# Patient Record
Sex: Male | Born: 1975 | Race: White | Hispanic: No | Smoking: Former smoker
Health system: Southern US, Community
[De-identification: ages and names within clinical notes are randomized; demographics above are authoritative.]

## PROBLEM LIST (undated history)

## (undated) DIAGNOSIS — I219 Acute myocardial infarction, unspecified: Secondary | ICD-10-CM

## (undated) DIAGNOSIS — F4310 Post-traumatic stress disorder, unspecified: Secondary | ICD-10-CM

## (undated) HISTORY — DX: Acute myocardial infarction, unspecified: I21.9

---

## 2020-05-01 ENCOUNTER — Inpatient Hospital Stay (HOSPITAL_COMMUNITY)
Admission: EM | Admit: 2020-05-01 | Discharge: 2020-05-11 | DRG: 215 | Disposition: A | Discharge status: Home / Self Care | Payer: BC Managed Care – PPO | Attending: Cardiovascular Disease | Admitting: Cardiovascular Disease

## 2020-05-01 ENCOUNTER — Encounter (HOSPITAL_COMMUNITY)
Admission: EM | Disposition: A | Discharge status: Home / Self Care | Payer: Self-pay | Source: Home / Self Care | Attending: Cardiovascular Disease

## 2020-05-01 ENCOUNTER — Emergency Department (HOSPITAL_COMMUNITY): Payer: BC Managed Care – PPO

## 2020-05-01 DIAGNOSIS — R7303 Prediabetes: Secondary | ICD-10-CM | POA: Diagnosis present

## 2020-05-01 DIAGNOSIS — J9601 Acute respiratory failure with hypoxia: Secondary | ICD-10-CM | POA: Diagnosis present

## 2020-05-01 DIAGNOSIS — I4901 Ventricular fibrillation: Secondary | ICD-10-CM | POA: Diagnosis present

## 2020-05-01 DIAGNOSIS — G9341 Metabolic encephalopathy: Secondary | ICD-10-CM | POA: Diagnosis not present

## 2020-05-01 DIAGNOSIS — Z95810 Presence of automatic (implantable) cardiac defibrillator: Secondary | ICD-10-CM | POA: Diagnosis not present

## 2020-05-01 DIAGNOSIS — Z720 Tobacco use: Secondary | ICD-10-CM

## 2020-05-01 DIAGNOSIS — I5041 Acute combined systolic (congestive) and diastolic (congestive) heart failure: Secondary | ICD-10-CM | POA: Diagnosis present

## 2020-05-01 DIAGNOSIS — R570 Cardiogenic shock: Secondary | ICD-10-CM | POA: Diagnosis not present

## 2020-05-01 DIAGNOSIS — E872 Acidosis: Secondary | ICD-10-CM | POA: Diagnosis present

## 2020-05-01 DIAGNOSIS — S299XXA Unspecified injury of thorax, initial encounter: Secondary | ICD-10-CM | POA: Diagnosis present

## 2020-05-01 DIAGNOSIS — I469 Cardiac arrest, cause unspecified: Secondary | ICD-10-CM

## 2020-05-01 DIAGNOSIS — I462 Cardiac arrest due to underlying cardiac condition: Secondary | ICD-10-CM | POA: Diagnosis present

## 2020-05-01 DIAGNOSIS — J14 Pneumonia due to Hemophilus influenzae: Secondary | ICD-10-CM | POA: Diagnosis not present

## 2020-05-01 DIAGNOSIS — K7200 Acute and subacute hepatic failure without coma: Secondary | ICD-10-CM | POA: Diagnosis present

## 2020-05-01 DIAGNOSIS — I2582 Chronic total occlusion of coronary artery: Secondary | ICD-10-CM | POA: Diagnosis present

## 2020-05-01 DIAGNOSIS — E875 Hyperkalemia: Secondary | ICD-10-CM | POA: Diagnosis not present

## 2020-05-01 DIAGNOSIS — N179 Acute kidney failure, unspecified: Secondary | ICD-10-CM | POA: Diagnosis present

## 2020-05-01 DIAGNOSIS — I5021 Acute systolic (congestive) heart failure: Secondary | ICD-10-CM | POA: Diagnosis not present

## 2020-05-01 DIAGNOSIS — R0603 Acute respiratory distress: Secondary | ICD-10-CM

## 2020-05-01 DIAGNOSIS — J690 Pneumonitis due to inhalation of food and vomit: Secondary | ICD-10-CM | POA: Diagnosis not present

## 2020-05-01 DIAGNOSIS — I2510 Atherosclerotic heart disease of native coronary artery without angina pectoris: Secondary | ICD-10-CM

## 2020-05-01 DIAGNOSIS — E876 Hypokalemia: Secondary | ICD-10-CM | POA: Diagnosis present

## 2020-05-01 DIAGNOSIS — I509 Heart failure, unspecified: Secondary | ICD-10-CM

## 2020-05-01 DIAGNOSIS — E785 Hyperlipidemia, unspecified: Secondary | ICD-10-CM | POA: Diagnosis present

## 2020-05-01 DIAGNOSIS — Z20822 Contact with and (suspected) exposure to covid-19: Secondary | ICD-10-CM | POA: Diagnosis present

## 2020-05-01 DIAGNOSIS — I472 Ventricular tachycardia: Secondary | ICD-10-CM | POA: Diagnosis not present

## 2020-05-01 DIAGNOSIS — S2241XA Multiple fractures of ribs, right side, initial encounter for closed fracture: Secondary | ICD-10-CM | POA: Diagnosis present

## 2020-05-01 DIAGNOSIS — Z885 Allergy status to narcotic agent status: Secondary | ICD-10-CM

## 2020-05-01 DIAGNOSIS — I2102 ST elevation (STEMI) myocardial infarction involving left anterior descending coronary artery: Principal | ICD-10-CM | POA: Diagnosis present

## 2020-05-01 DIAGNOSIS — Z0189 Encounter for other specified special examinations: Secondary | ICD-10-CM

## 2020-05-01 DIAGNOSIS — I255 Ischemic cardiomyopathy: Secondary | ICD-10-CM | POA: Diagnosis not present

## 2020-05-01 DIAGNOSIS — Z6834 Body mass index (BMI) 34.0-34.9, adult: Secondary | ICD-10-CM

## 2020-05-01 DIAGNOSIS — E871 Hypo-osmolality and hyponatremia: Secondary | ICD-10-CM | POA: Diagnosis not present

## 2020-05-01 DIAGNOSIS — F17210 Nicotine dependence, cigarettes, uncomplicated: Secondary | ICD-10-CM | POA: Diagnosis present

## 2020-05-01 DIAGNOSIS — I213 ST elevation (STEMI) myocardial infarction of unspecified site: Secondary | ICD-10-CM

## 2020-05-01 DIAGNOSIS — J9602 Acute respiratory failure with hypercapnia: Secondary | ICD-10-CM | POA: Diagnosis present

## 2020-05-01 DIAGNOSIS — Z9289 Personal history of other medical treatment: Secondary | ICD-10-CM

## 2020-05-01 HISTORY — PX: VENTRICULAR ASSIST DEVICE INSERTION: CATH118273

## 2020-05-01 HISTORY — PX: LEFT HEART CATH AND CORONARY ANGIOGRAPHY: CATH118249

## 2020-05-01 HISTORY — PX: CORONARY/GRAFT ACUTE MI REVASCULARIZATION: CATH118305

## 2020-05-01 LAB — POCT I-STAT 7, (LYTES, BLD GAS, ICA,H+H)
Acid-base deficit: 6 mmol/L — ABNORMAL HIGH (ref 0.0–2.0)
Acid-base deficit: 9 mmol/L — ABNORMAL HIGH (ref 0.0–2.0)
Acid-base deficit: 5 mmol/L — ABNORMAL HIGH (ref 0.0–2.0)
Bicarbonate: 19.2 mmol/L — ABNORMAL LOW (ref 20.0–28.0)
Bicarbonate: 21.8 mmol/L (ref 20.0–28.0)
Bicarbonate: 21.2 mmol/L (ref 20.0–28.0)
Calcium, Ion: 1.17 mmol/L (ref 1.15–1.40)
Calcium, Ion: 1.17 mmol/L (ref 1.15–1.40)
Calcium, Ion: 1.09 mmol/L — ABNORMAL LOW (ref 1.15–1.40)
HCT: 41 % (ref 39.0–52.0)
HCT: 43 % (ref 39.0–52.0)
HCT: 39 % (ref 39.0–52.0)
Hemoglobin: 13.9 g/dL (ref 13.0–17.0)
Hemoglobin: 14.6 g/dL (ref 13.0–17.0)
Hemoglobin: 13.3 g/dL (ref 13.0–17.0)
O2 Saturation: 100 %
O2 Saturation: 100 %
O2 Saturation: 100 %
Patient temperature: 98.9
Patient temperature: 98.1
Potassium: 3.1 mmol/L — ABNORMAL LOW (ref 3.5–5.1)
Potassium: 3.5 mmol/L (ref 3.5–5.1)
Potassium: 4.3 mmol/L (ref 3.5–5.1)
Sodium: 136 mmol/L (ref 135–145)
Sodium: 136 mmol/L (ref 135–145)
Sodium: 137 mmol/L (ref 135–145)
TCO2: 21 mmol/L — ABNORMAL LOW (ref 22–32)
TCO2: 23 mmol/L (ref 22–32)
TCO2: 22 mmol/L (ref 22–32)
pCO2 arterial: 49.6 mmHg — ABNORMAL HIGH (ref 32.0–48.0)
pCO2 arterial: 51.6 mmHg — ABNORMAL HIGH (ref 32.0–48.0)
pCO2 arterial: 41.1 mmHg (ref 32.0–48.0)
pH, Arterial: 7.18 — CL (ref 7.350–7.450)
pH, Arterial: 7.25 — ABNORMAL LOW (ref 7.350–7.450)
pH, Arterial: 7.319 — ABNORMAL LOW (ref 7.350–7.450)
pO2, Arterial: 355 mmHg — ABNORMAL HIGH (ref 83.0–108.0)
pO2, Arterial: 503 mmHg — ABNORMAL HIGH (ref 83.0–108.0)
pO2, Arterial: 235 mmHg — ABNORMAL HIGH (ref 83.0–108.0)

## 2020-05-01 LAB — COMPREHENSIVE METABOLIC PANEL
ALT: 166 U/L — ABNORMAL HIGH (ref 0–44)
AST: 168 U/L — ABNORMAL HIGH (ref 15–41)
Albumin: 3.7 g/dL (ref 3.5–5.0)
Alkaline Phosphatase: 74 U/L (ref 38–126)
Anion gap: 16 — ABNORMAL HIGH (ref 5–15)
BUN: 15 mg/dL (ref 6–20)
CO2: 16 mmol/L — ABNORMAL LOW (ref 22–32)
Calcium: 8.5 mg/dL — ABNORMAL LOW (ref 8.9–10.3)
Chloride: 103 mmol/L (ref 98–111)
Creatinine, Ser: 1.32 mg/dL — ABNORMAL HIGH (ref 0.61–1.24)
GFR, Estimated: >60 mL/min (ref >60)
Glucose, Bld: 221 mg/dL — ABNORMAL HIGH (ref 70–99)
Potassium: 3.2 mmol/L — ABNORMAL LOW (ref 3.5–5.1)
Sodium: 135 mmol/L (ref 135–145)
Total Bilirubin: 0.9 mg/dL (ref 0.3–1.2)
Total Protein: 6.3 g/dL — ABNORMAL LOW (ref 6.5–8.1)

## 2020-05-01 LAB — CBC WITH DIFFERENTIAL/PLATELET
Abs Immature Granulocytes: 0.4 10*3/uL — ABNORMAL HIGH (ref 0.00–0.07)
Basophils Absolute: 0.1 10*3/uL (ref 0.0–0.1)
Basophils Relative: 0 %
Eosinophils Absolute: 0.1 10*3/uL (ref 0.0–0.5)
Eosinophils Relative: 1 %
HCT: 43.5 % (ref 39.0–52.0)
Hemoglobin: 14.3 g/dL (ref 13.0–17.0)
Immature Granulocytes: 2 %
Lymphocytes Relative: 31 %
Lymphs Abs: 5.9 10*3/uL — ABNORMAL HIGH (ref 0.7–4.0)
MCH: 31.6 pg (ref 26.0–34.0)
MCHC: 32.9 g/dL (ref 30.0–36.0)
MCV: 96 fL (ref 80.0–100.0)
Monocytes Absolute: 1.1 10*3/uL — ABNORMAL HIGH (ref 0.1–1.0)
Monocytes Relative: 6 %
Neutro Abs: 11.7 10*3/uL — ABNORMAL HIGH (ref 1.7–7.7)
Neutrophils Relative %: 60 %
Platelets: 303 10*3/uL (ref 150–400)
RBC: 4.53 MIL/uL (ref 4.22–5.81)
RDW: 12.9 % (ref 11.5–15.5)
WBC: 19.2 10*3/uL — ABNORMAL HIGH (ref 4.0–10.5)
nRBC: 0 % (ref 0.0–0.2)

## 2020-05-01 LAB — TROPONIN I (HIGH SENSITIVITY): Troponin I (High Sensitivity): 5667 ng/L (ref <18)

## 2020-05-01 LAB — I-STAT CHEM 8, ED
BUN: 17 mg/dL (ref 6–20)
Calcium, Ion: 1.08 mmol/L — ABNORMAL LOW (ref 1.15–1.40)
Chloride: 105 mmol/L (ref 98–111)
Creatinine, Ser: 1.2 mg/dL (ref 0.61–1.24)
Glucose, Bld: 228 mg/dL — ABNORMAL HIGH (ref 70–99)
HCT: 43 % (ref 39.0–52.0)
Hemoglobin: 14.6 g/dL (ref 13.0–17.0)
Potassium: 3.1 mmol/L — ABNORMAL LOW (ref 3.5–5.1)
Sodium: 138 mmol/L (ref 135–145)
TCO2: 17 mmol/L — ABNORMAL LOW (ref 22–32)

## 2020-05-01 LAB — POCT ACTIVATED CLOTTING TIME
Activated Clotting Time: 267 seconds
Activated Clotting Time: 297 seconds

## 2020-05-01 LAB — HEMOGLOBIN A1C
Hgb A1c MFr Bld: 6.1 % — ABNORMAL HIGH (ref 4.8–5.6)
Mean Plasma Glucose: 128.37 mg/dL

## 2020-05-01 LAB — APTT: aPTT: 28 seconds (ref 24–36)

## 2020-05-01 LAB — LIPID PANEL
Cholesterol: 242 mg/dL — ABNORMAL HIGH (ref 0–200)
HDL: 29 mg/dL — ABNORMAL LOW (ref >40)
LDL Cholesterol: UNDETERMINED mg/dL (ref 0–99)
Total CHOL/HDL Ratio: 8.3 RATIO
Triglycerides: 469 mg/dL — ABNORMAL HIGH (ref <150)
VLDL: UNDETERMINED mg/dL (ref 0–40)

## 2020-05-01 LAB — GLUCOSE, CAPILLARY: Glucose-Capillary: 177 mg/dL — ABNORMAL HIGH (ref 70–99)

## 2020-05-01 LAB — PROTIME-INR
INR: 1.1 (ref 0.8–1.2)
Prothrombin Time: 13.8 seconds (ref 11.4–15.2)

## 2020-05-01 LAB — LDL CHOLESTEROL, DIRECT: Direct LDL: 106.8 mg/dL — ABNORMAL HIGH (ref 0–99)

## 2020-05-01 SURGERY — CORONARY/GRAFT ACUTE MI REVASCULARIZATION
Anesthesia: LOCAL

## 2020-05-01 MED ORDER — TIROFIBAN HCL IN NACL 5-0.9 MG/100ML-% IV SOLN
0.1500 ug/kg/min | INTRAVENOUS | Status: DC
  Administered 2020-05-02: 0.15 ug/kg/min via INTRAVENOUS
  Filled 2020-05-01: qty 100

## 2020-05-01 MED ORDER — FENTANYL CITRATE (PF) 100 MCG/2ML IJ SOLN
INTRAMUSCULAR | Status: DC | PRN
  Administered 2020-05-01 (×2): 50 ug via INTRAVENOUS

## 2020-05-01 MED ORDER — TIROFIBAN (AGGRASTAT) BOLUS VIA INFUSION
INTRAVENOUS | Status: DC | PRN
  Administered 2020-05-01: 3118.475 ug via INTRAVENOUS

## 2020-05-01 MED ORDER — FENTANYL CITRATE (PF) 100 MCG/2ML IJ SOLN: 50.0000 ug | Freq: Once | INTRAMUSCULAR | Status: DC

## 2020-05-01 MED ORDER — NOREPINEPHRINE 4 MG/250ML-% IV SOLN
0.0000 ug/min | INTRAVENOUS | Status: DC
  Administered 2020-05-02: 8 ug/min via INTRAVENOUS
  Administered 2020-05-02: 12 ug/min via INTRAVENOUS
  Administered 2020-05-02: 5 ug/min via INTRAVENOUS
  Administered 2020-05-03 (×2): 10 ug/min via INTRAVENOUS
  Administered 2020-05-05: 2 ug/min via INTRAVENOUS
  Filled 2020-05-01 (×5): qty 250

## 2020-05-01 MED ORDER — HEPARIN SODIUM (PORCINE) 1000 UNIT/ML IJ SOLN
INTRAMUSCULAR | Status: DC | PRN
  Administered 2020-05-01: 3000 [IU] via INTRAVENOUS

## 2020-05-01 MED ORDER — HEPARIN SODIUM (PORCINE) 1000 UNIT/ML IJ SOLN
INTRAMUSCULAR | Status: DC | PRN
  Administered 2020-05-01: 12000 [IU] via INTRAVENOUS

## 2020-05-01 MED ORDER — AMIODARONE HCL IN DEXTROSE 360-4.14 MG/200ML-% IV SOLN
60.0000 mg/h | INTRAVENOUS | Status: AC
  Filled 2020-05-01: qty 200

## 2020-05-01 MED ORDER — MIDAZOLAM HCL 2 MG/2ML IJ SOLN
INTRAMUSCULAR | Status: AC
  Filled 2020-05-01: qty 2

## 2020-05-01 MED ORDER — FENTANYL 2500MCG IN NS 250ML (10MCG/ML) PREMIX INFUSION
50.0000 ug/h | INTRAVENOUS | Status: DC
  Administered 2020-05-01: 100 ug/h via INTRAVENOUS
  Administered 2020-05-02 – 2020-05-03 (×4): 200 ug/h via INTRAVENOUS
  Administered 2020-05-04: 150 ug/h via INTRAVENOUS
  Administered 2020-05-05: 200 ug/h via INTRAVENOUS
  Administered 2020-05-05: 150 ug/h via INTRAVENOUS
  Administered 2020-05-05: 200 ug/h via INTRAVENOUS
  Filled 2020-05-01 (×10): qty 250

## 2020-05-01 MED ORDER — SODIUM CHLORIDE 0.9 % IV SOLN
INTRAVENOUS | Status: AC | PRN
  Administered 2020-05-01: 10 mL/h via INTRAVENOUS

## 2020-05-01 MED ORDER — SODIUM CHLORIDE 0.9 % IV SOLN
INTRAVENOUS | Status: AC | PRN
  Administered 2020-05-01: 140 mL/h via INTRAVENOUS

## 2020-05-01 MED ORDER — NITROGLYCERIN 0.4 MG SL SUBL: 0.4000 mg | SUBLINGUAL_TABLET | SUBLINGUAL | Status: DC | PRN

## 2020-05-01 MED ORDER — SODIUM BICARBONATE 8.4 % IV SOLN
INTRAVENOUS | Status: DC | PRN
  Administered 2020-05-01: 50 meq via INTRAVENOUS

## 2020-05-01 MED ORDER — MIDAZOLAM 50MG/50ML (1MG/ML) PREMIX INFUSION
0.5000 mg/h | INTRAVENOUS | Status: DC
  Administered 2020-05-01: 2 mg/h via INTRAVENOUS
  Administered 2020-05-02 (×2): 3 mg/h via INTRAVENOUS
  Administered 2020-05-03 (×2): 5 mg/h via INTRAVENOUS
  Administered 2020-05-04: 6 mg/h via INTRAVENOUS
  Filled 2020-05-01 (×5): qty 50

## 2020-05-01 MED ORDER — AMIODARONE HCL IN DEXTROSE 360-4.14 MG/200ML-% IV SOLN
INTRAVENOUS | Status: AC
  Administered 2020-05-02: 60 mg/h via INTRAVENOUS
  Filled 2020-05-01: qty 200

## 2020-05-01 MED ORDER — CANGRELOR BOLUS VIA INFUSION
INTRAVENOUS | Status: DC | PRN
  Administered 2020-05-01: 3742.17 ug via INTRAVENOUS

## 2020-05-01 MED ORDER — SODIUM CHLORIDE 0.9 % IV SOLN
INTRAVENOUS | Status: DC | PRN
  Administered 2020-05-01: 4 ug/kg/min via INTRAVENOUS

## 2020-05-01 MED ORDER — SODIUM BICARBONATE 8.4 % IV SOLN
INTRAVENOUS | Status: AC
  Filled 2020-05-01: qty 50

## 2020-05-01 MED ORDER — TIROFIBAN HCL IN NACL 5-0.9 MG/100ML-% IV SOLN
INTRAVENOUS | Status: AC
  Filled 2020-05-01: qty 100

## 2020-05-01 MED ORDER — LIDOCAINE HCL (PF) 1 % IJ SOLN
INTRAMUSCULAR | Status: DC | PRN
  Administered 2020-05-01 (×2): 15 mL

## 2020-05-01 MED ORDER — ONDANSETRON HCL 4 MG/2ML IJ SOLN: 4.0000 mg | Freq: Four times a day (QID) | INTRAMUSCULAR | Status: DC | PRN

## 2020-05-01 MED ORDER — HEPARIN SODIUM (PORCINE) 1000 UNIT/ML IJ SOLN
INTRAMUSCULAR | Status: AC
  Filled 2020-05-01: qty 2

## 2020-05-01 MED ORDER — FENTANYL CITRATE (PF) 100 MCG/2ML IJ SOLN
INTRAMUSCULAR | Status: AC
  Filled 2020-05-01: qty 2

## 2020-05-01 MED ORDER — ROCURONIUM BROMIDE 50 MG/5ML IV SOLN
100.0000 mg | Freq: Once | INTRAVENOUS | Status: AC
  Administered 2020-05-01: 100 mg via INTRAVENOUS
  Filled 2020-05-01: qty 10

## 2020-05-01 MED ORDER — MIDAZOLAM HCL 2 MG/2ML IJ SOLN
INTRAMUSCULAR | Status: DC | PRN
  Administered 2020-05-01 (×3): 2 mg via INTRAVENOUS

## 2020-05-01 MED ORDER — HEPARIN (PORCINE) IN NACL 1000-0.9 UT/500ML-% IV SOLN
INTRAVENOUS | Status: AC
  Filled 2020-05-01: qty 1000

## 2020-05-01 MED ORDER — KETAMINE HCL 50 MG/5ML IJ SOSY: 100.0000 mg | PREFILLED_SYRINGE | Freq: Once | INTRAMUSCULAR | Status: DC

## 2020-05-01 MED ORDER — HEPARIN (PORCINE) IN NACL 1000-0.9 UT/500ML-% IV SOLN
INTRAVENOUS | Status: DC | PRN
  Administered 2020-05-01 (×2): 500 mL

## 2020-05-01 MED ORDER — TIROFIBAN HCL IN NACL 5-0.9 MG/100ML-% IV SOLN
INTRAVENOUS | Status: AC | PRN
  Administered 2020-05-01: 0.15 ug/kg/min via INTRAVENOUS

## 2020-05-01 MED ORDER — CANGRELOR TETRASODIUM 50 MG IV SOLR
INTRAVENOUS | Status: AC
  Filled 2020-05-01: qty 50

## 2020-05-01 MED ORDER — HEPARIN (PORCINE) IN NACL 1000-0.9 UT/500ML-% IV SOLN
INTRAVENOUS | Status: DC | PRN
  Administered 2020-05-01: 500 mL

## 2020-05-01 MED ORDER — MIDAZOLAM 50MG/50ML (1MG/ML) PREMIX INFUSION
INTRAVENOUS | Status: AC
  Filled 2020-05-01: qty 50

## 2020-05-01 MED ORDER — ETOMIDATE 2 MG/ML IV SOLN
20.0000 mg | Freq: Once | INTRAVENOUS | Status: AC
  Administered 2020-05-01 – 2020-05-02 (×2): 20 mg via INTRAVENOUS

## 2020-05-01 MED ORDER — NOREPINEPHRINE 4 MG/250ML-% IV SOLN
INTRAVENOUS | Status: AC
  Filled 2020-05-01: qty 250

## 2020-05-01 MED ORDER — ASPIRIN 81 MG PO CHEW
81.0000 mg | CHEWABLE_TABLET | Freq: Every day | ORAL | Status: DC
  Administered 2020-05-02 – 2020-05-05 (×4): 81 mg
  Filled 2020-05-01 (×4): qty 1

## 2020-05-01 MED ORDER — FENTANYL BOLUS VIA INFUSION
50.0000 ug | INTRAVENOUS | Status: DC | PRN
Start: 2020-05-01 — End: 2020-05-06
  Administered 2020-05-02 – 2020-05-03 (×3): 50 ug via INTRAVENOUS
  Administered 2020-05-03 – 2020-05-05 (×6): 100 ug via INTRAVENOUS
  Administered 2020-05-05: 50 ug via INTRAVENOUS
  Administered 2020-05-05: 100 ug via INTRAVENOUS
  Administered 2020-05-05 (×2): 50 ug via INTRAVENOUS
  Administered 2020-05-05: 100 ug via INTRAVENOUS
  Administered 2020-05-05 (×2): 50 ug via INTRAVENOUS
  Administered 2020-05-06: 100 ug via INTRAVENOUS
  Administered 2020-05-06 (×2): 50 ug via INTRAVENOUS
  Administered 2020-05-06: 100 ug via INTRAVENOUS
  Filled 2020-05-01: qty 100

## 2020-05-01 MED ORDER — IOHEXOL 350 MG/ML SOLN
INTRAVENOUS | Status: DC | PRN
  Administered 2020-05-01: 75 mL

## 2020-05-01 MED ORDER — IOHEXOL 350 MG/ML SOLN
INTRAVENOUS | Status: AC
  Filled 2020-05-01: qty 1

## 2020-05-01 MED ORDER — ACETAMINOPHEN 160 MG/5ML PO SOLN
650.0000 mg | ORAL | Status: DC | PRN
  Administered 2020-05-03 – 2020-05-05 (×2): 650 mg
  Filled 2020-05-01 (×2): qty 20.3

## 2020-05-01 MED ORDER — HEPARIN (PORCINE) IN NACL 1000-0.9 UT/500ML-% IV SOLN
INTRAVENOUS | Status: AC
  Filled 2020-05-01: qty 500

## 2020-05-01 MED ORDER — ATORVASTATIN CALCIUM 80 MG PO TABS
80.0000 mg | ORAL_TABLET | Freq: Every day | ORAL | Status: DC
  Administered 2020-05-02 – 2020-05-05 (×4): 80 mg
  Filled 2020-05-01 (×4): qty 1

## 2020-05-01 MED ORDER — LIDOCAINE HCL (PF) 1 % IJ SOLN
INTRAMUSCULAR | Status: AC
  Filled 2020-05-01: qty 30

## 2020-05-01 MED ORDER — AMIODARONE HCL IN DEXTROSE 360-4.14 MG/200ML-% IV SOLN
30.0000 mg/h | INTRAVENOUS | Status: DC
  Administered 2020-05-02 – 2020-05-04 (×6): 30 mg/h via INTRAVENOUS
  Filled 2020-05-01 (×4): qty 200

## 2020-05-01 SURGICAL SUPPLY — 26 items
BALLN SAPPHIRE 2.0X12 (BALLOONS) ×2
BALLOON SAPPHIRE 2.0X12 (BALLOONS) ×1 IMPLANT
CATH 5FR JL3.5 JR4 ANG PIG MP (CATHETERS) ×2 IMPLANT
CATH EXPO 5F MPA-1 (CATHETERS) ×2 IMPLANT
CATH INFINITI 5FR JL4 (CATHETERS) ×2 IMPLANT
CATH SWAN GANZ VIP 7.5F (CATHETERS) ×2 IMPLANT
CATH VISTA GUIDE 6FR XBLAD3.5 (CATHETERS) ×2 IMPLANT
GLIDESHEATH SLEND SS 6F .021 (SHEATH) ×2 IMPLANT
GUIDEWIRE INQWIRE 1.5J.035X260 (WIRE) ×1 IMPLANT
INQWIRE 1.5J .035X260CM (WIRE) ×2
KIT ESSENTIALS PG (KITS) ×2 IMPLANT
KIT HEART LEFT (KITS) ×2 IMPLANT
KIT MICROPUNCTURE NIT STIFF (SHEATH) ×2 IMPLANT
MAT PREVALON FULL STRYKER (MISCELLANEOUS) ×2 IMPLANT
PACK CARDIAC CATHETERIZATION (CUSTOM PROCEDURE TRAY) ×2 IMPLANT
SET IMPELLA CP PUMP (CATHETERS) ×2 IMPLANT
SHEATH PINNACLE 5F 10CM (SHEATH) ×2 IMPLANT
SHEATH PINNACLE 6F 10CM (SHEATH) ×2 IMPLANT
SHEATH PINNACLE 8F 10CM (SHEATH) ×2 IMPLANT
SHEATH PROBE COVER 6X72 (BAG) ×2 IMPLANT
SLEEVE REPOSITIONING LENGTH 30 (MISCELLANEOUS) ×2 IMPLANT
TRANSDUCER W/STOPCOCK (MISCELLANEOUS) ×2 IMPLANT
TUBING CIL FLEX 10 FLL-RA (TUBING) ×2 IMPLANT
WIRE COUGAR XT STRL 190CM (WIRE) ×2 IMPLANT
WIRE EMERALD 3MM-J .025X260CM (WIRE) ×2 IMPLANT
WIRE EMERALD 3MM-J .035X150CM (WIRE) ×2 IMPLANT

## 2020-05-01 NOTE — Progress Notes (Signed)
ANTICOAGULATION CONSULT NOTE - Initial Consult  Pharmacy Consult for heparin Indication: Impella CP  No Known Allergies  Patient Measurements:    Vital Signs: Temp: 86.4 F (30.2 C) (04/23 2040) Temp Source: Tympanic (04/23 2040) BP: 104/88 (04/23 2100) Pulse Rate: 120 (04/23 2100)  Labs: Recent Labs    05/01/20 2104 05/01/20 2113  HGB 14.6 13.9  HCT 43.0 41.0  CREATININE 1.20  --     CrCl cannot be calculated (Unknown ideal weight.).   Medical History: No past medical history on file.  Medications:  Scheduled:  . [START ON 05/02/2020] aspirin EC  81 mg Oral Daily  . atorvastatin  80 mg Oral Daily  . [MAR Hold] fentaNYL (SUBLIMAZE) injection  50 mcg Intravenous Once   Infusions:  . sodium chloride 10 mL/hr (05/01/20 2127)  . amiodarone    . [START ON 05/02/2020] amiodarone    . fentaNYL infusion INTRAVENOUS    . norepinephrine (LEVOPHED) Adult infusion      Assessment: 33 yom with OOH cardiac arrest (PEA>CPR>VF, received 2 shocks), EKG showing STEMI. No known AC PTA.  Hgb 13.9, plt 303. Trop [id number]. Plan for aggrastat for 6 hours post-cath.   Received dextrose purge in cath lab - plan to change to standard heparin purge on arrival to unit. Will check hourly ACT, once <180 will start systemic heparin.   Goal of Therapy:  Heparin level 0.2-0.5 units/ml Monitor platelets by anticoagulation protocol: Yes   Plan:  Start heparin purge solution (50 units/mL) on arrival to CVICU Check hourly ACT - once ACT<180, can start systemic heparin infusion Monitor heparin level q6 hr  Monitor s/sx of bleeding, CBC, and HL daily   Sherron Monday, PharmD, BCCCP Clinical Pharmacist  Phone: [phone] 05/01/2020 9:34 PM  Please check AMION for all [facility] Pharmacy phone numbers After 10:00 PM, call Main Pharmacy [phone]

## 2020-05-01 NOTE — Progress Notes (Signed)
Pt transported from ED Trauma A to Cath Lab with no complications.

## 2020-05-01 NOTE — ED Notes (Signed)
amio 150 mg bolus and drip started by sophie rn

## 2020-05-01 NOTE — ED Notes (Signed)
100mg  ketamine given by sophie rn

## 2020-05-01 NOTE — ED Notes (Signed)
Xray at bedside for ems post intubation confirmation
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
## 2020-05-01 NOTE — ED Notes (Signed)
Wife Hart Robinsons [phone] would like an update asap

## 2020-05-01 NOTE — ED Notes (Signed)
Pt 20mg  etomidate and 100mg  rocc given by sophie rn

## 2020-05-01 NOTE — Progress Notes (Signed)
eLink Physician-Brief Progress Note Patient Name: Tyler Jacobson DOB: 1975/11/22 MRN: [id number]   Date of Service  05/01/2020  HPI/Events of Note  Patient with out of hospital cardiac arrest following STEMI, he underwent emergent cardiac cath but PCI was precluded by a distal LAD lesion. He is in cardiogenic shock, on Impella, with acute respiratory failure on the ventilator.  eICU Interventions  New Patient Evaluation completed, E-Link electrolyte protocol ordered per RN request.        Migdalia Dk 05/01/2020, 11:53 PM

## 2020-05-01 NOTE — ED Notes (Signed)
ED Provider dr Audley Hose at bedside.
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
## 2020-05-01 NOTE — Progress Notes (Signed)
   05/01/20 2027  Clinical Encounter Type  Visited With Patient not available  Visit Type Trauma  Referral From Nurse  Consult/Referral To Chaplain  This chaplain received page. Chaplain remains available if needed. This note was prepared by Deneen Harts, M.Div..  For questions please contact by phone [phone].

## 2020-05-01 NOTE — ED Notes (Signed)
 of levo started by sophie rn
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
## 2020-05-02 ENCOUNTER — Inpatient Hospital Stay (HOSPITAL_COMMUNITY): Payer: BC Managed Care – PPO

## 2020-05-02 DIAGNOSIS — I469 Cardiac arrest, cause unspecified: Secondary | ICD-10-CM

## 2020-05-02 DIAGNOSIS — I472 Ventricular tachycardia: Secondary | ICD-10-CM

## 2020-05-02 DIAGNOSIS — I2510 Atherosclerotic heart disease of native coronary artery without angina pectoris: Secondary | ICD-10-CM | POA: Diagnosis not present

## 2020-05-02 DIAGNOSIS — R570 Cardiogenic shock: Secondary | ICD-10-CM

## 2020-05-02 DIAGNOSIS — E876 Hypokalemia: Secondary | ICD-10-CM

## 2020-05-02 DIAGNOSIS — I2102 ST elevation (STEMI) myocardial infarction involving left anterior descending coronary artery: Principal | ICD-10-CM

## 2020-05-02 DIAGNOSIS — Z720 Tobacco use: Secondary | ICD-10-CM

## 2020-05-02 DIAGNOSIS — I4901 Ventricular fibrillation: Secondary | ICD-10-CM

## 2020-05-02 LAB — BASIC METABOLIC PANEL
Anion gap: 11 (ref 5–15)
Anion gap: 7 (ref 5–15)
BUN: 16 mg/dL (ref 6–20)
BUN: 18 mg/dL (ref 6–20)
CO2: 17 mmol/L — ABNORMAL LOW (ref 22–32)
CO2: 20 mmol/L — ABNORMAL LOW (ref 22–32)
Calcium: 8 mg/dL — ABNORMAL LOW (ref 8.9–10.3)
Calcium: 7.9 mg/dL — ABNORMAL LOW (ref 8.9–10.3)
Chloride: 104 mmol/L (ref 98–111)
Chloride: 105 mmol/L (ref 98–111)
Creatinine, Ser: 1.59 mg/dL — ABNORMAL HIGH (ref 0.61–1.24)
Creatinine, Ser: 1.5 mg/dL — ABNORMAL HIGH (ref 0.61–1.24)
GFR, Estimated: 55 mL/min — ABNORMAL LOW (ref >60)
GFR, Estimated: 59 mL/min — ABNORMAL LOW (ref >60)
Glucose, Bld: 152 mg/dL — ABNORMAL HIGH (ref 70–99)
Glucose, Bld: 150 mg/dL — ABNORMAL HIGH (ref 70–99)
Potassium: 5.8 mmol/L — ABNORMAL HIGH (ref 3.5–5.1)
Potassium: 5.4 mmol/L — ABNORMAL HIGH (ref 3.5–5.1)
Sodium: 132 mmol/L — ABNORMAL LOW (ref 135–145)
Sodium: 132 mmol/L — ABNORMAL LOW (ref 135–145)

## 2020-05-02 LAB — POCT I-STAT 7, (LYTES, BLD GAS, ICA,H+H)
Acid-base deficit: 5 mmol/L — ABNORMAL HIGH (ref 0.0–2.0)
Acid-base deficit: 7 mmol/L — ABNORMAL HIGH (ref 0.0–2.0)
Bicarbonate: 22.2 mmol/L (ref 20.0–28.0)
Bicarbonate: 18.3 mmol/L — ABNORMAL LOW (ref 20.0–28.0)
Calcium, Ion: 1.16 mmol/L (ref 1.15–1.40)
Calcium, Ion: 1.12 mmol/L — ABNORMAL LOW (ref 1.15–1.40)
HCT: 39 % (ref 39.0–52.0)
HCT: 34 % — ABNORMAL LOW (ref 39.0–52.0)
Hemoglobin: 13.3 g/dL (ref 13.0–17.0)
Hemoglobin: 11.6 g/dL — ABNORMAL LOW (ref 13.0–17.0)
O2 Saturation: 100 %
O2 Saturation: 93 %
Patient temperature: 37
Patient temperature: 36.9
Potassium: 5.9 mmol/L — ABNORMAL HIGH (ref 3.5–5.1)
Potassium: 4.6 mmol/L (ref 3.5–5.1)
Sodium: 133 mmol/L — ABNORMAL LOW (ref 135–145)
Sodium: 133 mmol/L — ABNORMAL LOW (ref 135–145)
TCO2: 24 mmol/L (ref 22–32)
TCO2: 19 mmol/L — ABNORMAL LOW (ref 22–32)
pCO2 arterial: 46 mmHg (ref 32.0–48.0)
pCO2 arterial: 33.7 mmHg (ref 32.0–48.0)
pH, Arterial: 7.291 — ABNORMAL LOW (ref 7.350–7.450)
pH, Arterial: 7.343 — ABNORMAL LOW (ref 7.350–7.450)
pO2, Arterial: 219 mmHg — ABNORMAL HIGH (ref 83.0–108.0)
pO2, Arterial: 71 mmHg — ABNORMAL LOW (ref 83.0–108.0)

## 2020-05-02 LAB — MAGNESIUM: Magnesium: 2.7 mg/dL — ABNORMAL HIGH (ref 1.7–2.4)

## 2020-05-02 LAB — MRSA PCR SCREENING: MRSA by PCR: NEGATIVE

## 2020-05-02 LAB — URINALYSIS, ROUTINE W REFLEX MICROSCOPIC
Bacteria, UA: NONE SEEN
Bilirubin Urine: NEGATIVE
Glucose, UA: NEGATIVE mg/dL
Hgb urine dipstick: NEGATIVE
Ketones, ur: NEGATIVE mg/dL
Nitrite: NEGATIVE
Protein, ur: NEGATIVE mg/dL
Specific Gravity, Urine: 1.03 (ref 1.005–1.030)
pH: 5 (ref 5.0–8.0)

## 2020-05-02 LAB — COOXEMETRY PANEL
Carboxyhemoglobin: 0.7 % (ref 0.5–1.5)
Methemoglobin: 1.3 % (ref 0.0–1.5)
O2 Saturation: 60.4 %
Total hemoglobin: 11 g/dL — ABNORMAL LOW (ref 12.0–16.0)

## 2020-05-02 LAB — CBC
HCT: 38.9 % — ABNORMAL LOW (ref 39.0–52.0)
Hemoglobin: 13.2 g/dL (ref 13.0–17.0)
MCH: 31.5 pg (ref 26.0–34.0)
MCHC: 33.9 g/dL (ref 30.0–36.0)
MCV: 92.8 fL (ref 80.0–100.0)
Platelets: 327 10*3/uL (ref 150–400)
RBC: 4.19 MIL/uL — ABNORMAL LOW (ref 4.22–5.81)
RDW: 13.3 % (ref 11.5–15.5)
WBC: 15.9 10*3/uL — ABNORMAL HIGH (ref 4.0–10.5)
nRBC: 0 % (ref 0.0–0.2)

## 2020-05-02 LAB — LACTIC ACID, PLASMA
Lactic Acid, Venous: 2.3 mmol/L (ref 0.5–1.9)
Lactic Acid, Venous: 2.1 mmol/L (ref 0.5–1.9)

## 2020-05-02 LAB — ECHOCARDIOGRAM COMPLETE
Area-P 1/2: 3.37 cm2
Height: 67 in
S' Lateral: 3 cm
Weight: 3594.38 oz

## 2020-05-02 LAB — GLUCOSE, CAPILLARY
Glucose-Capillary: 154 mg/dL — ABNORMAL HIGH (ref 70–99)
Glucose-Capillary: 148 mg/dL — ABNORMAL HIGH (ref 70–99)
Glucose-Capillary: 167 mg/dL — ABNORMAL HIGH (ref 70–99)

## 2020-05-02 LAB — HEPARIN LEVEL (UNFRACTIONATED): Heparin Unfractionated: 0.1 IU/mL — ABNORMAL LOW (ref 0.30–0.70)

## 2020-05-02 LAB — FIBRINOGEN: Fibrinogen: 354 mg/dL (ref 210–475)

## 2020-05-02 LAB — POTASSIUM
Potassium: 3.7 mmol/L (ref 3.5–5.1)
Potassium: 3.7 mmol/L (ref 3.5–5.1)
Potassium: 3.7 mmol/L (ref 3.5–5.1)

## 2020-05-02 LAB — HIV ANTIBODY (ROUTINE TESTING W REFLEX): HIV Screen 4th Generation wRfx: NONREACTIVE

## 2020-05-02 LAB — POCT ACTIVATED CLOTTING TIME
Activated Clotting Time: 160 seconds
Activated Clotting Time: 148 seconds

## 2020-05-02 LAB — PROTIME-INR
INR: 1.1 (ref 0.8–1.2)
Prothrombin Time: 14 seconds (ref 11.4–15.2)

## 2020-05-02 LAB — TROPONIN I (HIGH SENSITIVITY): Troponin I (High Sensitivity): 20612 ng/L (ref <18)

## 2020-05-02 LAB — LACTATE DEHYDROGENASE: LDH: 594 U/L — ABNORMAL HIGH (ref 98–192)

## 2020-05-02 LAB — SARS CORONAVIRUS 2 (TAT 6-24 HRS): SARS Coronavirus 2: NEGATIVE

## 2020-05-02 MED ORDER — SODIUM CHLORIDE 0.9 % IV SOLN
INTRAVENOUS | Status: DC | PRN
  Administered 2020-05-02: 1000 mL via INTRAVENOUS

## 2020-05-02 MED ORDER — ETOMIDATE 2 MG/ML IV SOLN
INTRAVENOUS | Status: AC
  Administered 2020-05-02: 20 mg via INTRAVENOUS
  Filled 2020-05-02: qty 10

## 2020-05-02 MED ORDER — ATROPINE SULFATE 1 MG/10ML IJ SOSY
PREFILLED_SYRINGE | INTRAMUSCULAR | Status: AC
  Filled 2020-05-02: qty 10

## 2020-05-02 MED ORDER — CHLORHEXIDINE GLUCONATE CLOTH 2 % EX PADS
6.0000 | MEDICATED_PAD | Freq: Every day | CUTANEOUS | Status: DC
  Administered 2020-05-02 – 2020-05-09 (×7): 6 via TOPICAL

## 2020-05-02 MED ORDER — TICAGRELOR 90 MG PO TABS
90.0000 mg | ORAL_TABLET | Freq: Two times a day (BID) | ORAL | Status: DC
  Administered 2020-05-02 – 2020-05-05 (×7): 90 mg
  Filled 2020-05-02 (×6): qty 1

## 2020-05-02 MED ORDER — ACETAMINOPHEN 325 MG PO TABS: 650.0000 mg | ORAL_TABLET | ORAL | Status: DC | PRN

## 2020-05-02 MED ORDER — ASPIRIN 81 MG PO CHEW: 324.0000 mg | CHEWABLE_TABLET | Freq: Once | ORAL | Status: DC

## 2020-05-02 MED ORDER — SODIUM CHLORIDE 0.9% FLUSH
3.0000 mL | Freq: Two times a day (BID) | INTRAVENOUS | Status: DC
  Administered 2020-05-02 – 2020-05-09 (×11): 3 mL via INTRAVENOUS

## 2020-05-02 MED ORDER — ETOMIDATE 2 MG/ML IV SOLN: 20.0000 mg | Freq: Once | INTRAVENOUS | Status: AC

## 2020-05-02 MED ORDER — POTASSIUM CHLORIDE 10 MEQ/100ML IV SOLN
10.0000 meq | INTRAVENOUS | Status: DC
  Administered 2020-05-02 (×2): 10 meq via INTRAVENOUS
  Filled 2020-05-02 (×2): qty 100

## 2020-05-02 MED ORDER — DOCUSATE SODIUM 50 MG/5ML PO LIQD
100.0000 mg | Freq: Two times a day (BID) | ORAL | Status: DC
  Administered 2020-05-02 – 2020-05-05 (×8): 100 mg
  Filled 2020-05-02 (×8): qty 10

## 2020-05-02 MED ORDER — MIDAZOLAM HCL 2 MG/2ML IJ SOLN
2.0000 mg | INTRAMUSCULAR | Status: DC | PRN
  Administered 2020-05-02 – 2020-05-04 (×4): 2 mg via INTRAVENOUS

## 2020-05-02 MED ORDER — DOBUTAMINE IN D5W 4-5 MG/ML-% IV SOLN
1.0000 ug/kg/min | INTRAVENOUS | Status: DC
  Administered 2020-05-02: 2.5 ug/kg/min via INTRAVENOUS
  Filled 2020-05-02: qty 250

## 2020-05-02 MED ORDER — INSULIN ASPART 100 UNIT/ML ~~LOC~~ SOLN
0.0000 [IU] | SUBCUTANEOUS | Status: DC
  Administered 2020-05-02 (×2): 2 [IU] via SUBCUTANEOUS
  Administered 2020-05-02: 3 [IU] via SUBCUTANEOUS
  Administered 2020-05-02: 2 [IU] via SUBCUTANEOUS
  Administered 2020-05-02: 8 [IU] via SUBCUTANEOUS
  Administered 2020-05-03: 2 [IU] via SUBCUTANEOUS
  Administered 2020-05-03: 3 [IU] via SUBCUTANEOUS
  Administered 2020-05-03 (×2): 2 [IU] via SUBCUTANEOUS
  Administered 2020-05-04: 5 [IU] via SUBCUTANEOUS
  Administered 2020-05-04 – 2020-05-05 (×7): 2 [IU] via SUBCUTANEOUS
  Administered 2020-05-05 (×2): 3 [IU] via SUBCUTANEOUS
  Administered 2020-05-06 – 2020-05-08 (×7): 2 [IU] via SUBCUTANEOUS

## 2020-05-02 MED ORDER — PROPOFOL 1000 MG/100ML IV EMUL
INTRAVENOUS | Status: AC
  Administered 2020-05-02: 50 ug/kg/min via INTRAVENOUS
  Filled 2020-05-02: qty 100

## 2020-05-02 MED ORDER — TICAGRELOR 90 MG PO TABS
90.0000 mg | ORAL_TABLET | Freq: Two times a day (BID) | ORAL | Status: DC
  Administered 2020-05-02: 90 mg via ORAL
  Filled 2020-05-02 (×2): qty 1

## 2020-05-02 MED ORDER — ASPIRIN 81 MG PO CHEW
324.0000 mg | CHEWABLE_TABLET | Freq: Once | ORAL | Status: AC
  Administered 2020-05-02: 324 mg
  Filled 2020-05-02: qty 4

## 2020-05-02 MED ORDER — CHLORHEXIDINE GLUCONATE 0.12% ORAL RINSE (MEDLINE KIT)
15.0000 mL | Freq: Two times a day (BID) | OROMUCOSAL | Status: DC
  Administered 2020-05-02 – 2020-05-10 (×15): 15 mL via OROMUCOSAL

## 2020-05-02 MED ORDER — TICAGRELOR 90 MG PO TABS: 180.0000 mg | ORAL_TABLET | Freq: Once | ORAL | Status: DC

## 2020-05-02 MED ORDER — SODIUM CHLORIDE 0.9% FLUSH
10.0000 mL | Freq: Two times a day (BID) | INTRAVENOUS | Status: DC
  Administered 2020-05-02: 10 mL
  Administered 2020-05-02: 30 mL
  Administered 2020-05-04 – 2020-05-09 (×9): 10 mL

## 2020-05-02 MED ORDER — TIROFIBAN HCL IN NACL 5-0.9 MG/100ML-% IV SOLN: 0.1500 ug/kg/min | INTRAVENOUS | Status: DC

## 2020-05-02 MED ORDER — TICAGRELOR 90 MG PO TABS
180.0000 mg | ORAL_TABLET | Freq: Once | ORAL | Status: AC
  Administered 2020-05-02: 180 mg
  Filled 2020-05-02: qty 2

## 2020-05-02 MED ORDER — PROPOFOL 1000 MG/100ML IV EMUL: 5.0000 ug/kg/min | INTRAVENOUS | Status: DC

## 2020-05-02 MED ORDER — ORAL CARE MOUTH RINSE
15.0000 mL | OROMUCOSAL | Status: DC
  Administered 2020-05-02 – 2020-05-06 (×38): 15 mL via OROMUCOSAL

## 2020-05-02 MED ORDER — INSULIN ASPART 100 UNIT/ML IV SOLN
10.0000 [IU] | Freq: Once | INTRAVENOUS | Status: AC
  Administered 2020-05-02: 10 [IU] via INTRAVENOUS

## 2020-05-02 MED ORDER — SODIUM CHLORIDE 0.9 % IV SOLN: INTRAVENOUS | Status: AC

## 2020-05-02 MED ORDER — SODIUM CHLORIDE 0.9% IV SOLUTION: INTRAVENOUS | Status: DC | PRN

## 2020-05-02 MED ORDER — POLYETHYLENE GLYCOL 3350 17 G PO PACK
17.0000 g | PACK | Freq: Every day | ORAL | Status: DC
  Administered 2020-05-02: 17 g
  Filled 2020-05-02: qty 1

## 2020-05-02 MED ORDER — HYDRALAZINE HCL 20 MG/ML IJ SOLN: 10.0000 mg | INTRAMUSCULAR | Status: AC | PRN

## 2020-05-02 MED ORDER — SODIUM CHLORIDE 0.9% FLUSH: 10.0000 mL | INTRAVENOUS | Status: DC | PRN

## 2020-05-02 MED ORDER — PERFLUTREN LIPID MICROSPHERE
1.0000 mL | INTRAVENOUS | Status: AC | PRN
  Administered 2020-05-02: 2 mL via INTRAVENOUS
  Filled 2020-05-02: qty 10

## 2020-05-02 MED ORDER — SODIUM ZIRCONIUM CYCLOSILICATE 10 G PO PACK
10.0000 g | PACK | Freq: Two times a day (BID) | ORAL | Status: DC
  Administered 2020-05-02: 10 g
  Filled 2020-05-02 (×2): qty 1

## 2020-05-02 MED ORDER — FUROSEMIDE 10 MG/ML IJ SOLN
40.0000 mg | Freq: Once | INTRAMUSCULAR | Status: AC
  Administered 2020-05-02: 40 mg via INTRAVENOUS
  Filled 2020-05-02: qty 4

## 2020-05-02 MED ORDER — NITROGLYCERIN 1 MG/10 ML FOR IR/CATH LAB
INTRA_ARTERIAL | Status: AC
  Filled 2020-05-02: qty 10

## 2020-05-02 MED ORDER — PROPOFOL BOLUS VIA INFUSION
100.0000 mg | Freq: Once | INTRAVENOUS | Status: AC
  Administered 2020-05-02: 100 mg via INTRAVENOUS
  Filled 2020-05-02: qty 100

## 2020-05-02 MED ORDER — SODIUM CHLORIDE 0.9% FLUSH: 3.0000 mL | INTRAVENOUS | Status: DC | PRN

## 2020-05-02 MED ORDER — ONDANSETRON HCL 4 MG/2ML IJ SOLN: 4.0000 mg | Freq: Four times a day (QID) | INTRAMUSCULAR | Status: DC | PRN

## 2020-05-02 MED ORDER — CALCIUM GLUCONATE-NACL 1-0.675 GM/50ML-% IV SOLN
1.0000 g | Freq: Once | INTRAVENOUS | Status: AC
  Administered 2020-05-02: 1000 mg via INTRAVENOUS
  Filled 2020-05-02: qty 50

## 2020-05-02 MED ORDER — MAGNESIUM SULFATE 2 GM/50ML IV SOLN
2.0000 g | Freq: Once | INTRAVENOUS | Status: AC
  Administered 2020-05-02: 2 g via INTRAVENOUS
  Filled 2020-05-02: qty 50

## 2020-05-02 MED ORDER — SODIUM CHLORIDE 0.9 % IV SOLN: 250.0000 mL | INTRAVENOUS | Status: DC | PRN

## 2020-05-02 MED ORDER — PANTOPRAZOLE SODIUM 40 MG IV SOLR
40.0000 mg | Freq: Every day | INTRAVENOUS | Status: DC
  Administered 2020-05-02 – 2020-05-05 (×4): 40 mg via INTRAVENOUS
  Filled 2020-05-02 (×4): qty 40

## 2020-05-02 MED ORDER — LABETALOL HCL 5 MG/ML IV SOLN: 10.0000 mg | INTRAVENOUS | Status: AC | PRN

## 2020-05-02 MED ORDER — HEPARIN (PORCINE) 25000 UT/250ML-% IV SOLN
450.0000 [IU]/h | INTRAVENOUS | Status: DC
  Administered 2020-05-02: 100 [IU]/h via INTRAVENOUS
  Filled 2020-05-02: qty 250

## 2020-05-02 MED ORDER — TIROFIBAN HCL IN NACL 5-0.9 MG/100ML-% IV SOLN
0.1500 ug/kg/min | INTRAVENOUS | Status: AC
  Administered 2020-05-02: 0.15 ug/kg/min via INTRAVENOUS
  Filled 2020-05-02: qty 100

## 2020-05-02 MED ORDER — PROPOFOL 1000 MG/100ML IV EMUL
0.0000 ug/kg/min | INTRAVENOUS | Status: DC
  Administered 2020-05-02 – 2020-05-03 (×6): 50 ug/kg/min via INTRAVENOUS
  Filled 2020-05-02 (×3): qty 100

## 2020-05-02 MED ORDER — DEXTROSE 50 % IV SOLN
1.0000 | Freq: Once | INTRAVENOUS | Status: AC
  Administered 2020-05-02: 50 mL via INTRAVENOUS
  Filled 2020-05-02: qty 50

## 2020-05-02 MED ORDER — HEPARIN SODIUM (PORCINE) 5000 UNIT/ML IJ SOLN
INTRAVENOUS | Status: DC
  Filled 2020-05-02: qty 10
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
## 2020-05-02 NOTE — Progress Notes (Addendum)
eLink Physician-Brief Progress Note Patient Name: Eulon Allnutt DOB: 1975-12-28 MRN: [id number]   Date of Service  05/02/2020  HPI/Events of Note  ABG reviewed. K+ noted 5.9 on ISTAT result readout.  eICU Interventions  FIO2 reduced to 50 %. Respiratory rate increased to 21. KCL bolus order discontinued.        Thomasene Lot Earlean Fidalgo 05/02/2020, 4:46 AM
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
## 2020-05-02 NOTE — Plan of Care (Signed)
  Problem: Safety: Goal: Non-violent Restraint(s) Outcome: Progressing   Problem: Education: Goal: Understanding of cardiac disease, CV risk reduction, and recovery process will improve Outcome: Not Progressing Goal: Understanding of medication regimen will improve Outcome: Not Progressing Goal: Individualized Educational Video(s) Outcome: Not Progressing   Problem: Activity: Goal: Ability to tolerate increased activity will improve Outcome: Not Progressing   Problem: Cardiac: Goal: Ability to achieve and maintain adequate cardiopulmonary perfusion will improve Outcome: Progressing Goal: Vascular access site(s) Level 0-1 will be maintained Outcome: Progressing   Problem: Health Behavior/Discharge Planning: Goal: Ability to safely manage health-related needs after discharge will improve Outcome: Not Progressing
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
## 2020-05-02 NOTE — Progress Notes (Signed)
Per Dr. Warrick Parisian, do not give PM dose of lokelma d/t normalized potassium levels.

## 2020-05-02 NOTE — Progress Notes (Signed)
Cardiologist notified of CO/CI 3.12/1.47, no UOP for the last hour. Urine remains clear. Current BP stable with MAP between 74-78 on levo . PAP 39/20 CVP 13. Impella on P7. No current changes to POC.
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
## 2020-05-02 NOTE — Progress Notes (Addendum)
Called Elink to report AM ABG and electrolytes on iSTAT. Awaiting further orders.   Lactic acid 2.3 reported to Dr. Brayton Layman. No new orders received.

## 2020-05-02 NOTE — Progress Notes (Signed)
Progress Note  Patient Name: Tyler Jacobson Date of Encounter: 05/02/2020  [facility] Cardiologist: No primary care provider on file.   Subjective   Admitted yesterday evening. STEMI with OHCA. Impella placed. Distal LAD 100% occlusion--too small for PCI.   Inpatient Medications    Scheduled Meds: . aspirin  81 mg Per Tube Daily  . atorvastatin  80 mg Per Tube Daily  . chlorhexidine gluconate (MEDLINE KIT)  15 mL Mouth Rinse BID  . Chlorhexidine Gluconate Cloth  6 each Topical Daily  . insulin aspart  10 Units Intravenous Once   And  . dextrose  1 ampule Intravenous Once  . fentaNYL (SUBLIMAZE) injection  50 mcg Intravenous Once  . insulin aspart  0-15 Units Subcutaneous Q4H  . mouth rinse  15 mL Mouth Rinse 10 times per day  . pantoprazole (PROTONIX) IV  40 mg Intravenous Daily  . sodium chloride flush  10-40 mL Intracatheter Q12H  . sodium chloride flush  3 mL Intravenous Q12H  . sodium zirconium cyclosilicate  10 g Per Tube BID  . ticagrelor  90 mg Oral BID   Continuous Infusions: . sodium chloride Stopped (05/02/20 0623)  . sodium chloride    . sodium chloride    . amiodarone 30 mg/hr (05/02/20 0700)  . fentaNYL infusion INTRAVENOUS 200 mcg/hr (05/02/20 0848)  . impella catheter heparin 50 unit/mL in dextrose 5%    . midazolam 3 mg/hr (05/02/20 0700)  . norepinephrine (LEVOPHED) Adult infusion 10 mcg/min (05/02/20 0700)   PRN Meds: sodium chloride, sodium chloride, sodium chloride, acetaminophen, fentaNYL, midazolam, nitroGLYCERIN, ondansetron (ZOFRAN) IV, sodium chloride flush, sodium chloride flush   Vital Signs    Vitals:   05/02/20 0615 05/02/20 0630 05/02/20 0645 05/02/20 0700  BP:    98/73  Pulse: 80 78 75 73  Resp: (!) 21 (!) 21 (!) 21 (!) 21  Temp: 98.6 F (37 C) 98.8 F (37.1 C)  98.96 F (37.2 C)  TempSrc:      SpO2: 93% 94% 96% 96%  Weight:      Height:        Intake/Output Summary (Last 24 hours) at 05/02/2020 0904 Last data  filed at 05/02/2020 0800 Gross per 24 hour  Intake 1468.85 ml  Output 565 ml  Net 903.85 ml   Last 3 Weights 05/02/2020 05/01/2020  Weight (lbs) 224 lb 10.4 oz 250 lb  Weight (kg) 101.9 kg 113.399 kg      Telemetry    Sinus rhythm with rare PVCs - Personally Reviewed  ECG    Inferolateral q waves - Personally Reviewed  Physical Exam   GEN: intubated/sedated  Neck: jvd to jaw Cardiac: RRR, no murmurs, rubs, or gallops. impella. Respiratory: ventilated. Intubated. GI: Soft, non-distended  MS: No edema; No deformity. Neuro:  sedated Psych: sedated  Labs    High Sensitivity Troponin:   Recent Labs  Lab 05/01/20 2104 05/02/20 0414  TROPONINIHS 5,667* 20,612*      Chemistry Recent Labs  Lab 05/01/20 2104 05/01/20 2113 05/01/20 2329 05/02/20 0414 05/02/20 0417  NA 135  138   < > 137 132* 133*  K 3.2*  3.1*   < > 4.3 5.8* 5.9*  CL 103  105  --   --  104  --   CO2 16*  --   --  17*  --   GLUCOSE 221*  228*  --   --  152*  --   BUN 15  17  --   --  16  --   CREATININE 1.32*  1.20  --   --  1.59*  --   CALCIUM 8.5*  --   --  8.0*  --   PROT 6.3*  --   --   --   --   ALBUMIN 3.7  --   --   --   --   AST 168*  --   --   --   --   ALT 166*  --   --   --   --   ALKPHOS 74  --   --   --   --   BILITOT 0.9  --   --   --   --   GFRNONAA >60  --   --  55*  --   ANIONGAP 16*  --   --  11  --    < > = values in this interval not displayed.     Hematology Recent Labs  Lab 05/01/20 2104 05/01/20 2113 05/01/20 2329 05/02/20 0414 05/02/20 0417  WBC 19.2*  --   --  15.9*  --   RBC 4.53  --   --  4.19*  --   HGB 14.3  14.6   < > 13.3 13.2 13.3  HCT 43.5  43.0   < > 39.0 38.9* 39.0  MCV 96.0  --   --  92.8  --   MCH 31.6  --   --  31.5  --   MCHC 32.9  --   --  33.9  --   RDW 12.9  --   --  13.3  --   PLT 303  --   --  327  --    < > = values in this interval not displayed.    BNPNo results for input(s): BNP, PROBNP in the last 168 hours.   DDimer No  results for input(s): DDIMER in the last 168 hours.   Radiology    DG Chest 1 View  Result Date: 05/01/2020 CLINICAL DATA:  Recent cardiac arrest. EXAM: CHEST  1 VIEW COMPARISON:  None. FINDINGS: The heart size and mediastinal contours are within normal limits. The lung volumes are low. There is a mild left basilar atelectasis/airspace disease. The right lung is clear. There is no pleural effusion or pneumothorax. The visualized skeletal structures are unremarkable. An endotracheal tube terminates approximately 1.4 cm from the carina. IMPRESSION: 1. Low lung volumes with mild left basilar atelectasis/airspace disease. 2. Endotracheal tube terminates approximately 1.4 cm from the carina. Electronically Signed   By: Zerita Boers M.D.   On: 05/01/2020 20:59   CARDIAC CATHETERIZATION  Result Date: 05/01/2020  Prox RCA to Mid RCA lesion is 30% stenosed.  Dist LAD lesion is 100% stenosed.  1. Acute anterior STEMI secondary to thrombotic occlusion of the apical/distal LAD 2. Cardiac arrest 3. Mild non-obstructive disease in the mid RCA 4. Cardiogenic shock-Impella placement for hemodynamic support. Recommendations: He will be admitted to the ICU in critical condition. Will continue Impella for hemodynamic support. Will load with Brilinta 180 mg po x 1 when the OG tube is in place. Will load with ASA 325 mg po x 1. Aggrastat for six hours. Heparin per pharmacy for Impella. High intensity statin. Low dose Levophed to be titrated. Continue amiodarone drip for now. Impella rep is present to assist with device optimization. PCCM to assist with vent management. Echo team paged to evaluate the depth of the Impella device.   Portable chest x-ray 1  view  Result Date: 05/02/2020 CLINICAL DATA:  ST elevation myocardial infarct. Endotracheal tube present. EXAM: PORTABLE CHEST 1 VIEW COMPARISON:  05/02/2020 FINDINGS: Endotracheal tube, Swan-Ganz catheter, and nasogastric tube remain in appropriate position. Low lung  volumes are again noted. Band like opacities in the right upper lobe and left lung base are unchanged and consistent with subsegmental atelectasis. Heart size is within normal limits. No pneumothorax or pleural effusion visualized. IMPRESSION: Stable low lung volumes and bilateral subsegmental atelectasis. Electronically Signed   By: Marlaine Hind M.D.   On: 05/02/2020 08:11   DG CHEST PORT 1 VIEW  Result Date: 05/02/2020 CLINICAL DATA:  OG tube placement.  Code STEMI EXAM: PORTABLE CHEST 1 VIEW COMPARISON:  05/01/2020 FINDINGS: Endotracheal tube is unchanged in position. An enteric tube has been placed with tip off the field of view but below the left hemidiaphragm. Right and left heart catheters have been placed via inferior approach. Heart size is normal. Shallow inspiration with atelectasis of the right upper lung. No pleural effusions. No pneumothorax. IMPRESSION: Appliances appear in satisfactory location. Shallow inspiration with atelectasis of the right upper lung. Electronically Signed   By: Lucienne Capers M.D.   On: 05/02/2020 00:58   DG Abd Portable 1V  Result Date: 05/02/2020 CLINICAL DATA:  OG tube placement EXAM: PORTABLE ABDOMEN - 1 VIEW COMPARISON:  None. FINDINGS: Enteric tube tip is in the right upper quadrant consistent with location in the distal stomach. IMPRESSION: Negative. Electronically Signed   By: Lucienne Capers M.D.   On: 05/02/2020 00:59    Cardiac Studies   Echo pending    Assessment & Plan    45yo man admitted with STEMI and OHCA now in shock requiring impella support.  #VT/VF arrest Not fully revascularizable territory given the caliber of the vessel. Will require ICD prior to discharge pending hospital course. Agree with amioarone.  #CAD - asa 54m PO daily - atrova 834mPO daily - ticagrelor 9050mO BID  #Cardiogeni shock I/s/o STEMI - AHF team following    For questions or updates, please contact [facility] consult [email or web address]  for contact info under        Signed, CAMVickie EpleyD  05/02/2020, 9:04 AM

## 2020-05-02 NOTE — Progress Notes (Signed)
  Echocardiogram 2D Echocardiogram has been performed.  Tyler Jacobson 05/02/2020, 10:09 AM
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
## 2020-05-02 NOTE — Procedures (Signed)
Arterial Catheter Insertion Procedure Note  Tyler Jacobson  [id number]  Feb 04, 1975  Date:05/02/20  Time:2:36 AM    Provider Performing: Darcella Gasman [name]    Procedure: Insertion of Arterial Line (98338) with US guidance (25053)   Indication(s) Blood pressure monitoring and/or need for frequent ABGs  Consent Unable to obtain consent due to emergent nature of procedure.  Anesthesia None   Time Out Verified patient identification, verified procedure, site/side was marked, verified correct patient position, special equipment/implants available, medications/allergies/relevant history reviewed, required imaging and test results available.   Sterile Technique Maximal sterile technique including full sterile barrier drape, hand hygiene, sterile gown, sterile gloves, mask, hair covering, sterile ultrasound probe cover (if used).   Procedure Description Area of catheter insertion was cleaned with chlorhexidine and draped in sterile fashion. With real-time ultrasound guidance an arterial catheter was placed into the left radial artery.  Appropriate arterial tracings confirmed on monitor.     Complications/Tolerance None; patient tolerated the procedure well.   EBL Minimal   Specimen(s) None   Darcella Gasman [name], PA-C

## 2020-05-02 NOTE — Progress Notes (Signed)
Arterial sheath removed per order at 1155 with Wendie Chess RN. ACT was 148. Pressure held for 25 minutes, and pulses palpated throughout. No hematoma at site, and pressure dressing applied.

## 2020-05-02 NOTE — Progress Notes (Signed)
Cardiology note  Bedside ECHO using Butterfly done after the patient was in CICU and repositioned. Impella position was noted to be 5.7-6.3cms, flow was ~ 2.9l/min Repositioned impella with the help of Impella Rep and confirmed the position with bedside ECHO  Impella position at 3.6-3.7cms from AV Flow is ~ 3.2L/min @ P7 Good waveforms Marker is around 105cms at the right leg.  Right leg is cold, will do Doppler per protocol OG tube in - will give full dose Aspirin 335m and brillinta 1836mnow  - aspirin 8161mnd brillinta 80m43mD from trow  - continue aggrastat for 6 hours and then heparin gtt  - get daily labs, hemolysis labs and impella left at P7.  - follow CCM orders and recs  Met ex wife outside CICU- she does not know any medical problems that Mr GustSavidge or takes any meds. He is obese, sedentary life style and smokes daily.  RobiRenae Fickle Cardiology coverage.

## 2020-05-02 NOTE — Progress Notes (Signed)
eLink Physician-Brief Progress Note Patient Name: Tyler Jacobson DOB: 02/13/1975 MRN: [id number]   Date of Service  05/02/2020  HPI/Events of Note  Patient's urine appears cloudy.  eICU Interventions  Urinalysis ordered.        Migdalia Dk 05/02/2020, 9:21 PM

## 2020-05-03 ENCOUNTER — Other Ambulatory Visit (HOSPITAL_COMMUNITY): Payer: Self-pay

## 2020-05-03 ENCOUNTER — Inpatient Hospital Stay (HOSPITAL_COMMUNITY): Payer: BC Managed Care – PPO

## 2020-05-03 ENCOUNTER — Encounter (HOSPITAL_COMMUNITY): Payer: Self-pay | Admitting: Cardiovascular Disease

## 2020-05-03 ENCOUNTER — Inpatient Hospital Stay: Payer: Self-pay

## 2020-05-03 DIAGNOSIS — I5021 Acute systolic (congestive) heart failure: Secondary | ICD-10-CM

## 2020-05-03 DIAGNOSIS — R570 Cardiogenic shock: Secondary | ICD-10-CM | POA: Diagnosis not present

## 2020-05-03 LAB — POCT I-STAT 7, (LYTES, BLD GAS, ICA,H+H)
Acid-base deficit: 1 mmol/L (ref 0.0–2.0)
Acid-base deficit: 1 mmol/L (ref 0.0–2.0)
Bicarbonate: 25.9 mmol/L (ref 20.0–28.0)
Bicarbonate: 22.8 mmol/L (ref 20.0–28.0)
Calcium, Ion: 1.07 mmol/L — ABNORMAL LOW (ref 1.15–1.40)
Calcium, Ion: 1.18 mmol/L (ref 1.15–1.40)
HCT: 42 % (ref 39.0–52.0)
HCT: 32 % — ABNORMAL LOW (ref 39.0–52.0)
Hemoglobin: 14.3 g/dL (ref 13.0–17.0)
Hemoglobin: 10.9 g/dL — ABNORMAL LOW (ref 13.0–17.0)
O2 Saturation: 100 %
O2 Saturation: 97 %
Patient temperature: 38.1
Potassium: 3.5 mmol/L (ref 3.5–5.1)
Potassium: 3.5 mmol/L (ref 3.5–5.1)
Sodium: 137 mmol/L (ref 135–145)
Sodium: 134 mmol/L — ABNORMAL LOW (ref 135–145)
TCO2: 27 mmol/L (ref 22–32)
TCO2: 24 mmol/L (ref 22–32)
pCO2 arterial: 52.9 mmHg — ABNORMAL HIGH (ref 32.0–48.0)
pCO2 arterial: 35.3 mmHg (ref 32.0–48.0)
pH, Arterial: 7.298 — ABNORMAL LOW (ref 7.350–7.450)
pH, Arterial: 7.423 (ref 7.350–7.450)
pO2, Arterial: 474 mmHg — ABNORMAL HIGH (ref 83.0–108.0)
pO2, Arterial: 88 mmHg (ref 83.0–108.0)

## 2020-05-03 LAB — TRIGLYCERIDES: Triglycerides: 1422 mg/dL — ABNORMAL HIGH (ref <150)

## 2020-05-03 LAB — COOXEMETRY PANEL
Carboxyhemoglobin: 0.9 % (ref 0.5–1.5)
Carboxyhemoglobin: 0.9 % (ref 0.5–1.5)
Methemoglobin: 1.6 % — ABNORMAL HIGH (ref 0.0–1.5)
Methemoglobin: 1.1 % (ref 0.0–1.5)
O2 Saturation: 64.1 %
O2 Saturation: 74 %
Total hemoglobin: 13 g/dL (ref 12.0–16.0)
Total hemoglobin: 13.1 g/dL (ref 12.0–16.0)

## 2020-05-03 LAB — POCT I-STAT, CHEM 8
BUN: 17 mg/dL (ref 6–20)
Calcium, Ion: 1.22 mmol/L (ref 1.15–1.40)
Chloride: 103 mmol/L (ref 98–111)
Creatinine, Ser: 1 mg/dL (ref 0.61–1.24)
Glucose, Bld: 207 mg/dL — ABNORMAL HIGH (ref 70–99)
HCT: 45 % (ref 39.0–52.0)
Hemoglobin: 15.3 g/dL (ref 13.0–17.0)
Potassium: 3.5 mmol/L (ref 3.5–5.1)
Sodium: 138 mmol/L (ref 135–145)
TCO2: 21 mmol/L — ABNORMAL LOW (ref 22–32)

## 2020-05-03 LAB — COMPREHENSIVE METABOLIC PANEL
ALT: 88 U/L — ABNORMAL HIGH (ref 0–44)
AST: 98 U/L — ABNORMAL HIGH (ref 15–41)
Albumin: 3 g/dL — ABNORMAL LOW (ref 3.5–5.0)
Alkaline Phosphatase: 61 U/L (ref 38–126)
Anion gap: 10 (ref 5–15)
BUN: 12 mg/dL (ref 6–20)
CO2: 20 mmol/L — ABNORMAL LOW (ref 22–32)
Calcium: 8.1 mg/dL — ABNORMAL LOW (ref 8.9–10.3)
Chloride: 102 mmol/L (ref 98–111)
Creatinine, Ser: 1.35 mg/dL — ABNORMAL HIGH (ref 0.61–1.24)
GFR, Estimated: >60 mL/min (ref >60)
Glucose, Bld: 131 mg/dL — ABNORMAL HIGH (ref 70–99)
Potassium: 3.4 mmol/L — ABNORMAL LOW (ref 3.5–5.1)
Sodium: 132 mmol/L — ABNORMAL LOW (ref 135–145)
Total Bilirubin: 0.7 mg/dL (ref 0.3–1.2)
Total Protein: 5.5 g/dL — ABNORMAL LOW (ref 6.5–8.1)

## 2020-05-03 LAB — ECHOCARDIOGRAM LIMITED
Height: 67 in
Weight: 3548.52 oz

## 2020-05-03 LAB — GLUCOSE, CAPILLARY
Glucose-Capillary: 127 mg/dL — ABNORMAL HIGH (ref 70–99)
Glucose-Capillary: 135 mg/dL — ABNORMAL HIGH (ref 70–99)
Glucose-Capillary: 142 mg/dL — ABNORMAL HIGH (ref 70–99)
Glucose-Capillary: 265 mg/dL — ABNORMAL HIGH (ref 70–99)
Glucose-Capillary: 94 mg/dL (ref 70–99)
Glucose-Capillary: 154 mg/dL — ABNORMAL HIGH (ref 70–99)
Glucose-Capillary: 159 mg/dL — ABNORMAL HIGH (ref 70–99)
Glucose-Capillary: 139 mg/dL — ABNORMAL HIGH (ref 70–99)

## 2020-05-03 LAB — HEPARIN LEVEL (UNFRACTIONATED)
Heparin Unfractionated: <0.1 IU/mL — ABNORMAL LOW (ref 0.30–0.70)
Heparin Unfractionated: 0.15 IU/mL — ABNORMAL LOW (ref 0.30–0.70)

## 2020-05-03 LAB — LACTATE DEHYDROGENASE: LDH: 513 U/L — ABNORMAL HIGH (ref 98–192)

## 2020-05-03 LAB — CBC
HCT: 33.2 % — ABNORMAL LOW (ref 39.0–52.0)
Hemoglobin: 11.5 g/dL — ABNORMAL LOW (ref 13.0–17.0)
MCH: 32.3 pg (ref 26.0–34.0)
MCHC: 34.6 g/dL (ref 30.0–36.0)
MCV: 93.3 fL (ref 80.0–100.0)
Platelets: 228 10*3/uL (ref 150–400)
RBC: 3.56 MIL/uL — ABNORMAL LOW (ref 4.22–5.81)
RDW: 13.6 % (ref 11.5–15.5)
WBC: 11 10*3/uL — ABNORMAL HIGH (ref 4.0–10.5)
nRBC: 0 % (ref 0.0–0.2)

## 2020-05-03 LAB — MAGNESIUM: Magnesium: 2 mg/dL (ref 1.7–2.4)

## 2020-05-03 LAB — PHOSPHORUS: Phosphorus: 3.4 mg/dL (ref 2.5–4.6)

## 2020-05-03 LAB — POCT ACTIVATED CLOTTING TIME: Activated Clotting Time: 166 seconds

## 2020-05-03 MED ORDER — ARTIFICIAL TEARS OPHTHALMIC OINT
TOPICAL_OINTMENT | Freq: Once | OPHTHALMIC | Status: AC
  Filled 2020-05-03: qty 3.5

## 2020-05-03 MED ORDER — VANCOMYCIN HCL 10 G IV SOLR
2500.0000 mg | Freq: Once | INTRAVENOUS | Status: AC
  Administered 2020-05-03: 2500 mg via INTRAVENOUS
  Filled 2020-05-03: qty 2500

## 2020-05-03 MED ORDER — FUROSEMIDE 10 MG/ML IJ SOLN
40.0000 mg | Freq: Once | INTRAMUSCULAR | Status: AC
  Administered 2020-05-03: 40 mg via INTRAVENOUS
  Filled 2020-05-03: qty 4

## 2020-05-03 MED ORDER — SODIUM CHLORIDE 0.9% FLUSH: 10.0000 mL | INTRAVENOUS | Status: DC | PRN

## 2020-05-03 MED ORDER — POTASSIUM CHLORIDE 10 MEQ/100ML IV SOLN
10.0000 meq | INTRAVENOUS | Status: AC
  Administered 2020-05-03 (×3): 10 meq via INTRAVENOUS
  Filled 2020-05-03 (×3): qty 100

## 2020-05-03 MED ORDER — VITAL 1.5 CAL PO LIQD
1000.0000 mL | ORAL | Status: DC
  Administered 2020-05-03 – 2020-05-06 (×2): 1000 mL

## 2020-05-03 MED ORDER — PROSOURCE TF PO LIQD
45.0000 mL | Freq: Four times a day (QID) | ORAL | Status: DC
  Administered 2020-05-03 – 2020-05-05 (×9): 45 mL
  Filled 2020-05-03 (×9): qty 45

## 2020-05-03 MED ORDER — SODIUM CHLORIDE 0.9 % IV SOLN
2.0000 g | Freq: Three times a day (TID) | INTRAVENOUS | Status: DC
  Administered 2020-05-03 – 2020-05-05 (×6): 2 g via INTRAVENOUS
  Filled 2020-05-03 (×6): qty 2

## 2020-05-03 MED ORDER — POTASSIUM CHLORIDE 20 MEQ PO PACK: 40.0000 meq | PACK | Freq: Once | ORAL | Status: DC

## 2020-05-03 MED ORDER — VANCOMYCIN HCL 750 MG/150ML IV SOLN
750.0000 mg | Freq: Two times a day (BID) | INTRAVENOUS | Status: DC
  Administered 2020-05-04 – 2020-05-05 (×3): 750 mg via INTRAVENOUS
  Filled 2020-05-03 (×4): qty 150

## 2020-05-03 MED ORDER — SODIUM CHLORIDE 0.9% FLUSH
10.0000 mL | Freq: Two times a day (BID) | INTRAVENOUS | Status: DC
Start: 2020-05-03 — End: 2020-05-10
  Administered 2020-05-03 – 2020-05-09 (×12): 10 mL

## 2020-05-03 MED ORDER — VITAL HIGH PROTEIN PO LIQD: 1000.0000 mL | ORAL | Status: DC

## 2020-05-03 MED ORDER — POTASSIUM CHLORIDE 20 MEQ PO PACK
40.0000 meq | PACK | Freq: Once | ORAL | Status: AC
  Administered 2020-05-03: 40 meq
  Filled 2020-05-03: qty 2
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
## 2020-05-03 NOTE — Progress Notes (Signed)
AM triglycerides noted to be elevated on AM labs- will wean down propofol to off and use versed gtt for sedation. Will notify rounding team this AM of result.

## 2020-05-03 NOTE — Progress Notes (Signed)
Patient's ex-wife called and updates were provided. She described the patient as a "moderately heavy drinker" but was unable to elaborate the approximate amount. Patient's social history will be updated.

## 2020-05-03 NOTE — Progress Notes (Signed)
Peripherally Inserted Central Catheter Placement  The IV Nurse has discussed with the patient and/or persons authorized to consent for the patient, the purpose of this procedure and the potential benefits and risks involved with this procedure.  The benefits include less needle sticks, lab draws from the catheter, and the patient may be discharged home with the catheter. Risks include, but not limited to, infection, bleeding, blood clot (thrombus formation), and puncture of an artery; nerve damage and irregular heartbeat and possibility to perform a PICC exchange if needed/ordered by physician.  Alternatives to this procedure were also discussed.  Bard Power PICC patient education guide, fact sheet on infection prevention and patient information card has been provided to patient /or left at bedside.    PICC Placement Documentation  PICC Triple Lumen 05/03/20 PICC Right Brachial 40 cm 0 cm (Active)  Indication for Insertion or Continuance of Line Vasoactive infusions 05/03/20 1500  Exposed Catheter (cm) 0 cm 05/03/20 1500  Site Assessment Clean;Dry;Intact 05/03/20 1500  Lumen #1 Status Flushed;Saline locked;Blood return noted 05/03/20 1500  Lumen #2 Status Flushed;Saline locked;Blood return noted 05/03/20 1500  Lumen #3 Status Flushed;Saline locked;Blood return noted 05/03/20 1500  Dressing Type Transparent;Securing device 05/03/20 1500  Dressing Status Clean;Dry;Intact 05/03/20 1500  Antimicrobial disc in place? Yes 05/03/20 1500  Safety Lock Not Applicable 05/03/20 1500  Line Care Connections checked and tightened 05/03/20 1500  Dressing Change Due 05/10/20 05/03/20 1500       Franne Grip Renee 05/03/2020, 3:37 PM
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
## 2020-05-03 NOTE — Progress Notes (Signed)
ELink electrolyte replacement of potassium for this morning discontinued, ordered by another service.

## 2020-05-03 NOTE — Progress Notes (Signed)
Initial Nutrition Assessment  DOCUMENTATION CODES:   Not applicable  INTERVENTION:   Tube Feeding via OG: Vital 1.5 at 55 ml/hr Begin TF at 20 ml/hr; titrate by 10 mL q 8 hours until goal rate of 55 ml/hr Pro-Source TF 45 mL QID Provides 2140 kcals, 133 g of protein and 1003 mL of free water  Maintain HOB >30 degrees or Reverse Trendelenburg >10 degrees while TF infusing   NUTRITION DIAGNOSIS:   Inadequate oral intake related to acute illness as evidenced by NPO status.  GOAL:   Patient will meet greater than or equal to 90% of their needs  MONITOR:   Vent status,TF tolerance,Labs,Weight trends  REASON FOR ASSESSMENT:   Ventilator,Consult Enteral/tube feeding initiation and management  ASSESSMENT:   45 yo male admitted post VF arrest secondary to STEMI, cardiogenic shock requiring pressors, acute respiratory failure requiring intubation, AKI. PMH includes tobacco abuse   4/23 Impella placed, Intubated 4/25 Impella weaning  Pt remains on vent support, requiring levophed and dobutamine. Sedated, pt sat straight up in bed yesterday causing issues with Impella, no weaning trial today  Discussed nutrition poc with MD Shirlee Latch and received verbal order to start TF  OG tube in stomach per abd xray  Bed in reverse trendelenberg position >10 degrees on visit today.   Unable to obtain diet and weight history at this time  Labs: reviewed Meds: colace, miralax, ss novolog   NUTRITION - FOCUSED PHYSICAL EXAM:  Flowsheet Row Most Recent Value  Orbital Region No depletion  Upper Arm Region No depletion  Thoracic and Lumbar Region No depletion  Buccal Region Unable to assess  Temple Region No depletion  Clavicle Bone Region No depletion  Clavicle and Acromion Bone Region No depletion  Scapular Bone Region No depletion  Dorsal Hand No depletion  Patellar Region No depletion  Anterior Thigh Region No depletion  Posterior Calf Region No depletion  Edema (RD  Assessment) None       Diet Order:   Diet Order            Diet NPO time specified  Diet effective now                 EDUCATION NEEDS:   Not appropriate for education at this time  Skin:  Skin Assessment: Reviewed RN Assessment  Last BM:  PTA  Height:   Ht Readings from Last 1 Encounters:  05/02/20 5\' 7"  (1.702 m)    Weight:   Wt Readings from Last 1 Encounters:  05/03/20 100.6 kg    BMI:  Body mass index is 34.74 kg/m.  Estimated Nutritional Needs:   Kcal:  2000-2200 kcals  Protein:  130-150 g  Fluid:  >/= 2 L    05/05/20 MS, RDN, LDN, CNSC Registered Dietitian III Clinical Nutrition RD Pager and On-Call Pager Number Located in [place]

## 2020-05-03 NOTE — Progress Notes (Signed)
  Impella has been weaned down to P3.   Discussed with Dr Shirlee Latch. Plan to stop heparin drip and remove impella later this afternoon.  Discussed with cath lab. Staff nurse will update cath lab as well.   Linard Daft NP-C  3:53 PM

## 2020-05-03 NOTE — Progress Notes (Signed)
All pressure removed from FemoStop dome at 2345. Site is a level 0 with some slight bruising at the impella insertion site. There is a displaced hematoma on L lateral scrotum that has been present since my arrival on shift. Hematoma site remains soft surrounding hematoma and impella insertion site. Discussed and assessed with Cath lab staff.   After removal of pressure from FemoStop dome, site was assessed with Kennith Center, RN and we both agree site is stable. Will keep FemoStop on site for 2 hours prior to removal of device from patient.

## 2020-05-03 NOTE — Progress Notes (Signed)
Pharmacy Antibiotic Note  Tyler Jacobson is a 45 y.o. male admitted on 05/01/2020 with stemi/cardiac arrest.  Pharmacy has been consulted for empiric vancomycin and cefepime dosing due to fevers and ongoing shock.   Vancomycin 750 mg IV Q 12 hrs. Goal AUC 400-550. Expected AUC: 502  SCr used: 1.3   Plan: Vancomycin 2.5g now then 750mg  IV q12 hours  Cefepime 2g q8 hours Follow culture data and renal function and adjust dosing as indicated  Height: 5\' 7"  (170.2 cm) Weight: 100.6 kg (221 lb 12.5 oz) IBW/kg (Calculated) : 66.1  Temp (24hrs), Avg:100.4 F (38 C), Min:98.42 F (36.9 C), Max:101.66 F (38.7 C)  Recent Labs  Lab 05/01/20 2104 05/01/20 2125 05/02/20 0414 05/02/20 0901 05/03/20 0330  WBC 19.2*  --  15.9*  --  11.0*  CREATININE 1.32*  1.20 1.00 1.59* 1.50* 1.35*  LATICACIDVEN  --   --  2.3* 2.1*  --     Estimated Creatinine Clearance: 80.6 mL/min (A) (by C-G formula based on SCr of 1.35 mg/dL (H)).    Allergies  Allergen Reactions  . Demerol [Meperidine Hcl] Other (See Comments)    Childhood; was not tolerated well   Thank you for allowing pharmacy to be a part of this patient's care.  05/04/20 PharmD., BCPS Clinical Pharmacist 05/03/2020 12:35 PM

## 2020-05-03 NOTE — Progress Notes (Signed)
  Echocardiogram 2D Echocardiogram limited has been performed.  Leta Jungling M 05/03/2020, 9:18 AM

## 2020-05-03 NOTE — Progress Notes (Deleted)
[facility] ADULT ICU REPLACEMENT PROTOCOL   The patient does apply for the [facility] Adult ICU Electrolyte Replacment Protocol based on the criteria listed below:   1. Is GFR >/= 30 ml/min? Yes.    Patient's GFR today is >60 2. Is SCr </= 2? Yes.   Patient's SCr is 1.35 ml/kg/hr 3. Did SCr increase >/= 0.5 in 24 hours? No. 4. Abnormal electrolyte(s):  K 3.4 5. Ordered repletion with: protocol 6. If a panic level lab has been reported, has the CCM MD in charge been notified? Yes.  .   Physician:  Shawn Stall R Dainel Arcidiacono 05/03/2020 5:26 AM
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
## 2020-05-03 NOTE — Progress Notes (Signed)
Patient ID: Tyler Jacobson, male   DOB: February 21, 1975, 45 y.o.   MRN: [id number]     Advanced Heart Failure Rounding Note  PCP-Cardiologist: No primary care provider on file.   Subjective:    Patient is currently on dobutamine 2.5 + NE 5 + Impella CP.  MAP around 70.    Febrile to 101.6, WBCs 11.  CXR with ?left base infiltrate.  Sedated currently, per nursing will wake up and track (is very agitated when awakens).   Echo: EF 30-35% with apical akinesis.   Swan:  CVP 11 PA 40/22 CI 3 SVR 756 Co-ox 64%  Impella CP:  P6 Flow 2.7 No alarms LDH 513 plts 228  Objective:   Weight Range: 100.6 kg Body mass index is 34.74 kg/m.   Vital Signs:   Temp:  [98.24 F (36.8 C)-101.66 F (38.7 C)] 101.66 F (38.7 C) (04/25 0715) Pulse Rate:  [67-109] 107 (04/25 0715) Resp:  [10-26] 26 (04/25 0715) BP: (82-127)/(54-89) 117/84 (04/25 0700) SpO2:  [91 %-100 %] 97 % (04/25 0715) Arterial Line BP: (100-131)/(64-69) 100/64 (04/24 1100) FiO2 (%):  [40 %-70 %] 40 % (04/25 0345) Weight:  [100.6 kg] 100.6 kg (04/25 0500) Last BM Date:  (PTA)  Weight change: Filed Weights   05/01/20 2345 05/02/20 0100 05/03/20 0500  Weight: 113.4 kg 101.9 kg 100.6 kg    Intake/Output:   Intake/Output Summary (Last 24 hours) at 05/03/2020 0754 Last data filed at 05/03/2020 0700 Gross per 24 hour  Intake 3462.2 ml  Output 3395 ml  Net 67.2 ml      Physical Exam    General:  Sedated on vent HEENT: Normal Neck: Thick. JVP 10 cm. Carotids 2+ bilat; no bruits. No lymphadenopathy or thyromegaly appreciated. Cor: PMI nondisplaced. Regular rate & rhythm. No rubs, gallops or murmurs. Lungs: Clear Abdomen: Soft, nontender, nondistended. No hepatosplenomegaly. No bruits or masses. Good bowel sounds. Extremities: No cyanosis, clubbing, rash, edema Neuro: Sedated   Telemetry   NSR 100s (personally reviewed)   Labs    CBC Recent Labs    05/01/20 2104 05/01/20 2113 05/02/20 0414  05/02/20 0417 05/03/20 0330 05/03/20 0334  WBC 19.2*  --  15.9*  --  11.0*  --   NEUTROABS 11.7*  --   --   --   --   --   HGB 14.3  14.6   < > 13.2   < > 11.5* 10.9*  HCT 43.5  43.0   < > 38.9*   < > 33.2* 32.0*  MCV 96.0  --  92.8  --  93.3  --   PLT 303  --  327  --  228  --    < > = values in this interval not displayed.   Basic Metabolic Panel Recent Labs    05/02/20 0414 05/02/20 0417 05/02/20 0901 05/02/20 1106 05/03/20 0330 05/03/20 0334  NA 132*   < > 132*   < > 132* 134*  K 5.8*   < > 5.4*   < > 3.4* 3.5  CL 104  --  105  --  102  --   CO2 17*  --  20*  --  20*  --   GLUCOSE 152*  --  150*  --  131*  --   BUN 16  --  18  --  12  --   CREATININE 1.59*  --  1.50*  --  1.35*  --   CALCIUM 8.0*  --  7.9*  --  8.1*  --   MG 2.7*  --   --   --  2.0  --   PHOS  --   --   --   --  3.4  --    < > = values in this interval not displayed.   Liver Function Tests Recent Labs    05/01/20 2104 05/03/20 0330  AST 168* 98*  ALT 166* 88*  ALKPHOS 74 61  BILITOT 0.9 0.7  PROT 6.3* 5.5*  ALBUMIN 3.7 3.0*   No results for input(s): LIPASE, AMYLASE in the last 72 hours. Cardiac Enzymes No results for input(s): CKTOTAL, CKMB, CKMBINDEX, TROPONINI in the last 72 hours.  BNP: BNP (last 3 results) No results for input(s): BNP in the last 8760 hours.  ProBNP (last 3 results) No results for input(s): PROBNP in the last 8760 hours.   D-Dimer No results for input(s): DDIMER in the last 72 hours. Hemoglobin A1C Recent Labs    05/01/20 2104  HGBA1C 6.1*   Fasting Lipid Panel Recent Labs    05/01/20 2104 05/03/20 0330  CHOL 242*  --   HDL 29*  --   LDLCALC UNABLE TO CALCULATE IF TRIGLYCERIDE OVER 400 mg/dL  --   TRIG 469* 1,422*  CHOLHDL 8.3  --   LDLDIRECT 106.8*  --    Thyroid Function Tests No results for input(s): TSH, T4TOTAL, T3FREE, THYROIDAB in the last 72 hours.  Invalid input(s): FREET3  Other results:   Imaging    ECHOCARDIOGRAM  COMPLETE  Result Date: 05/02/2020    ECHOCARDIOGRAM REPORT   Patient Name:   VELDON WAGER Date of Exam: 05/02/2020 Medical Rec #:  [id number]         Height:       67.0 in Accession #:    [id number]        Weight:       224.6 lb Date of Birth:  1975/03/07          BSA:          2.125 m Patient Age:    62 years          BP:           98/73 mmHg Patient Gender: M                 HR:           72 bpm. Exam Location:  Inpatient Procedure: 2D Echo, Cardiac Doppler, Color Doppler and Intracardiac            Opacification Agent Indications:    CAD Native Vessel I25.10  History:        Patient has no prior history of Echocardiogram examinations.                 Previous Myocardial Infarction.  Sonographer:    Bernadene Person RDCS Referring Phys: [place] Comments: Patient is morbidly obese and Technically difficult study due to poor echo windows. IMPRESSIONS  1. Impella noted in LV; 4-4.5 cm from aortic annulus. Apical akinesis with overall moderate to severe LV dysfunction.  2. Left ventricular ejection fraction, by estimation, is 30 to 35%. The left ventricle has moderately decreased function. The left ventricle demonstrates regional wall motion abnormalities (see scoring diagram/findings for description). Left ventricular  diastolic parameters are consistent with Grade I diastolic dysfunction (impaired relaxation).  3. Right ventricular systolic function is normal. The right ventricular size is normal.  4. The mitral valve is normal  in structure. No evidence of mitral valve regurgitation. No evidence of mitral stenosis.  5. The aortic valve was not well visualized. Aortic valve regurgitation Not assessed.  6. The inferior vena cava is normal in size with greater than 50% respiratory variability, suggesting right atrial pressure of 3 mmHg. FINDINGS  Left Ventricle: Left ventricular ejection fraction, by estimation, is 30 to 35%. The left ventricle has moderately decreased function. The  left ventricle demonstrates regional wall motion abnormalities. Definity contrast agent was given IV to delineate the left ventricular endocardial borders. The left ventricular internal cavity size was normal in size. There is no left ventricular hypertrophy. Left ventricular diastolic parameters are consistent with Grade I diastolic dysfunction (impaired relaxation). Right Ventricle: The right ventricular size is normal. No increase in right ventricular wall thickness. Right ventricular systolic function is normal. Left Atrium: Left atrial size was normal in size. Right Atrium: Right atrial size was normal in size. Pericardium: There is no evidence of pericardial effusion. Mitral Valve: The mitral valve is normal in structure. No evidence of mitral valve regurgitation. No evidence of mitral valve stenosis. Tricuspid Valve: The tricuspid valve is normal in structure. Tricuspid valve regurgitation is not demonstrated. No evidence of tricuspid stenosis. Aortic Valve: The aortic valve was not well visualized. Aortic valve regurgitation Not assessed. Pulmonic Valve: The pulmonic valve was normal in structure. Pulmonic valve regurgitation is not visualized. No evidence of pulmonic stenosis. Aorta: The aortic root is normal in size and structure. Venous: The inferior vena cava is normal in size with greater than 50% respiratory variability, suggesting right atrial pressure of 3 mmHg.  Additional Comments: Impella noted in LV; 4-4.5 cm from aortic annulus. Apical akinesis with overall moderate to severe LV dysfunction. A venous catheter is visualized.  LEFT VENTRICLE PLAX 2D LVIDd:         3.80 cm  Diastology LVIDs:         3.00 cm  LV e' medial:    4.95 cm/s LV PW:         1.10 cm  LV E/e' medial:  10.5 LV IVS:        1.00 cm  LV e' lateral:   5.51 cm/s LVOT diam:     1.80 cm  LV E/e' lateral: 9.4 LV SV:         33 LV SV Index:   16 LVOT Area:     2.54 cm  RIGHT VENTRICLE RV S prime:     9.90 cm/s TAPSE (M-mode): 1.6 cm  LEFT ATRIUM             Index       RIGHT ATRIUM           Index LA diam:        3.80 cm 1.79 cm/m  RA Area:     10.20 cm LA Vol (A2C):   29.6 ml 13.93 ml/m RA Volume:   18.30 ml  8.61 ml/m LA Vol (A4C):   37.7 ml 17.74 ml/m LA Biplane Vol: 34.3 ml 16.14 ml/m  AORTIC VALVE LVOT Vmax:   66.60 cm/s LVOT Vmean:  45.700 cm/s LVOT VTI:    0.131 m  AORTA Ao Root diam: 2.90 cm Ao Asc diam:  3.70 cm MITRAL VALVE MV Area (PHT): 3.37 cm    SHUNTS MV Decel Time: 225 msec    Systemic VTI:  0.13 m MV E velocity: 51.80 cm/s  Systemic Diam: 1.80 cm MV A velocity: 78.30 cm/s MV E/A ratio:  0.66  Kirk Ruths MD Electronically signed by Kirk Ruths MD Signature Date/Time: 05/02/2020/11:25:35 AM    Final       Medications:     Scheduled Medications: . aspirin  81 mg Per Tube Daily  . atorvastatin  80 mg Per Tube Daily  . chlorhexidine gluconate (MEDLINE KIT)  15 mL Mouth Rinse BID  . Chlorhexidine Gluconate Cloth  6 each Topical Daily  . docusate  100 mg Per Tube BID  . fentaNYL (SUBLIMAZE) injection  50 mcg Intravenous Once  . furosemide  40 mg Intravenous Once  . insulin aspart  0-15 Units Subcutaneous Q4H  . mouth rinse  15 mL Mouth Rinse 10 times per day  . pantoprazole (PROTONIX) IV  40 mg Intravenous Daily  . polyethylene glycol  17 g Per Tube Daily  . sodium chloride flush  10-40 mL Intracatheter Q12H  . sodium chloride flush  3 mL Intravenous Q12H  . ticagrelor  90 mg Per Tube BID     Infusions: . sodium chloride    . sodium chloride 10 mL/hr at 05/03/20 0600  . amiodarone 30 mg/hr (05/03/20 0600)  . DOBUTamine 2.5 mcg/kg/min (05/03/20 0600)  . fentaNYL infusion INTRAVENOUS 200 mcg/hr (05/03/20 0600)  . heparin 350 Units/hr (05/03/20 0600)  . impella catheter heparin 50 unit/mL in dextrose 5%    . midazolam 4 mg/hr (05/03/20 0600)  . norepinephrine (LEVOPHED) Adult infusion 10 mcg/min (05/03/20 9509)  . potassium chloride    . propofol (DIPRIVAN) infusion 30 mcg/kg/min (05/03/20  0600)     PRN Medications:  sodium chloride, sodium chloride, sodium chloride, acetaminophen, fentaNYL, midazolam, nitroGLYCERIN, ondansetron (ZOFRAN) IV, sodium chloride flush, sodium chloride flush   Assessment/Plan   1. CAD: Anterior STEMI.  Occlusion of distal/apical LAD on cath.  Small caliber vessel, no intervention.  - Continue ASA 81 and Brilinta 90 bid.  - Continue statin.  2. Cardiac arrest: VF due to STEMI.  Rhythm stable here.  - Continue amiodarone for now.  - ICD for secondary prevention prior to discharge.  3. Cardiogenic shock:  Echo with EF 30-35%, apical akinesis.  Impella in place, CI by thermodilution and co-ox adequate today.  Impella at P6.  CVP 11, good UOP yesterday.  On dobutamine 2.5 + NE 5.   - Wean NE for MAP > 65.  - Continue dobutamine. - Decrease Impella to P5 today. Slow wean today given fevers and ongoing NE gtt, hopefully will be able to discontinue tomorrow. Will check position by echo this morning.  On heparin gtt with stable platelets and LDH. - Lasix 40 mg IV x 1 this morning.  4. AKI: Creatinine down to 1.35, follow closely.  5. ID: Febrile to 101.6, WBCs 11.  ?LLL infiltrate on CXR.   - Panculture. - Empiric coverage with vancomycin/cefepime.    CRITICAL CARE Performed by: Loralie Champagne  Total critical care time: 40 minutes  Critical care time was exclusive of separately billable procedures and treating other patients.  Critical care was necessary to treat or prevent imminent or life-threatening deterioration.  Critical care was time spent personally by me on the following activities: development of treatment plan with patient and/or surrogate as well as nursing, discussions with consultants, evaluation of patient's response to treatment, examination of patient, obtaining history from patient or surrogate, ordering and performing treatments and interventions, ordering and review of laboratory studies, ordering and review of radiographic  studies, pulse oximetry and re-evaluation of patient's condition.  Length of Stay: 2  Loralie Champagne, MD  05/03/2020, 7:54 AM  Advanced Heart Failure Team Pager [phone] (M-F; 7a - 5p)  Please contact [facility] for night-coverage after hours (5p -7a ) and weekends on [email or web address]

## 2020-05-03 NOTE — Progress Notes (Signed)
Impella removed from left femoral artery. Manual pressure applied for 30 minutes. Patient woke up some and heaved/bucked the et tube and re-bled. Manual pressure reapplied for 20 additional minutes, for a total of 50 minutes. Site level still 0 with slight ecchymosis present proximal to access site.   Tegaderm dressing applied, then a femstop applied as previously discussed with Dr. Shirlee Latch.  Bedrest begins at 19:00:00 with femostop applied at / 40mm below his systolic pressure.   Bilateral dp pulses were present and loud with doppler. Left dp also present with doppler.  Dr. Shirlee Latch updated.

## 2020-05-04 ENCOUNTER — Inpatient Hospital Stay (HOSPITAL_COMMUNITY): Payer: BC Managed Care – PPO

## 2020-05-04 DIAGNOSIS — R570 Cardiogenic shock: Secondary | ICD-10-CM | POA: Diagnosis not present

## 2020-05-04 LAB — CBC
HCT: 28.9 % — ABNORMAL LOW (ref 39.0–52.0)
Hemoglobin: 9.9 g/dL — ABNORMAL LOW (ref 13.0–17.0)
MCH: 31.7 pg (ref 26.0–34.0)
MCHC: 34.3 g/dL (ref 30.0–36.0)
MCV: 92.6 fL (ref 80.0–100.0)
Platelets: 176 10*3/uL (ref 150–400)
RBC: 3.12 MIL/uL — ABNORMAL LOW (ref 4.22–5.81)
RDW: 13.5 % (ref 11.5–15.5)
WBC: 10.9 10*3/uL — ABNORMAL HIGH (ref 4.0–10.5)
nRBC: 0 % (ref 0.0–0.2)

## 2020-05-04 LAB — COMPREHENSIVE METABOLIC PANEL
ALT: 59 U/L — ABNORMAL HIGH (ref 0–44)
AST: 56 U/L — ABNORMAL HIGH (ref 15–41)
Albumin: 2.5 g/dL — ABNORMAL LOW (ref 3.5–5.0)
Alkaline Phosphatase: 64 U/L (ref 38–126)
Anion gap: 7 (ref 5–15)
BUN: 15 mg/dL (ref 6–20)
CO2: 22 mmol/L (ref 22–32)
Calcium: 8.1 mg/dL — ABNORMAL LOW (ref 8.9–10.3)
Chloride: 103 mmol/L (ref 98–111)
Creatinine, Ser: 1.3 mg/dL — ABNORMAL HIGH (ref 0.61–1.24)
GFR, Estimated: >60 mL/min (ref >60)
Glucose, Bld: 112 mg/dL — ABNORMAL HIGH (ref 70–99)
Potassium: 4.1 mmol/L (ref 3.5–5.1)
Sodium: 132 mmol/L — ABNORMAL LOW (ref 135–145)
Total Bilirubin: 1.3 mg/dL — ABNORMAL HIGH (ref 0.3–1.2)
Total Protein: 5.3 g/dL — ABNORMAL LOW (ref 6.5–8.1)

## 2020-05-04 LAB — POCT I-STAT 7, (LYTES, BLD GAS, ICA,H+H)
Acid-base deficit: 2 mmol/L (ref 0.0–2.0)
Acid-base deficit: 3 mmol/L — ABNORMAL HIGH (ref 0.0–2.0)
Bicarbonate: 22.1 mmol/L (ref 20.0–28.0)
Bicarbonate: 21.4 mmol/L (ref 20.0–28.0)
Calcium, Ion: 1.18 mmol/L (ref 1.15–1.40)
Calcium, Ion: 1.2 mmol/L (ref 1.15–1.40)
HCT: 29 % — ABNORMAL LOW (ref 39.0–52.0)
HCT: 28 % — ABNORMAL LOW (ref 39.0–52.0)
Hemoglobin: 9.9 g/dL — ABNORMAL LOW (ref 13.0–17.0)
Hemoglobin: 9.5 g/dL — ABNORMAL LOW (ref 13.0–17.0)
O2 Saturation: 96 %
O2 Saturation: 92 %
Patient temperature: 99.5
Patient temperature: 100.3
Potassium: 3.8 mmol/L (ref 3.5–5.1)
Potassium: 3.7 mmol/L (ref 3.5–5.1)
Sodium: 134 mmol/L — ABNORMAL LOW (ref 135–145)
Sodium: 133 mmol/L — ABNORMAL LOW (ref 135–145)
TCO2: 23 mmol/L (ref 22–32)
TCO2: 22 mmol/L (ref 22–32)
pCO2 arterial: 34.5 mmHg (ref 32.0–48.0)
pCO2 arterial: 37.7 mmHg (ref 32.0–48.0)
pH, Arterial: 7.416 (ref 7.350–7.450)
pH, Arterial: 7.365 (ref 7.350–7.450)
pO2, Arterial: 82 mmHg — ABNORMAL LOW (ref 83.0–108.0)
pO2, Arterial: 70 mmHg — ABNORMAL LOW (ref 83.0–108.0)

## 2020-05-04 LAB — COOXEMETRY PANEL
Carboxyhemoglobin: 0.8 % (ref 0.5–1.5)
Methemoglobin: 1.2 % (ref 0.0–1.5)
O2 Saturation: 70.7 %
Total hemoglobin: 9.7 g/dL — ABNORMAL LOW (ref 12.0–16.0)

## 2020-05-04 LAB — GLUCOSE, CAPILLARY
Glucose-Capillary: 124 mg/dL — ABNORMAL HIGH (ref 70–99)
Glucose-Capillary: 129 mg/dL — ABNORMAL HIGH (ref 70–99)
Glucose-Capillary: 136 mg/dL — ABNORMAL HIGH (ref 70–99)
Glucose-Capillary: 138 mg/dL — ABNORMAL HIGH (ref 70–99)
Glucose-Capillary: 142 mg/dL — ABNORMAL HIGH (ref 70–99)
Glucose-Capillary: 145 mg/dL — ABNORMAL HIGH (ref 70–99)
Glucose-Capillary: 205 mg/dL — ABNORMAL HIGH (ref 70–99)

## 2020-05-04 LAB — URINE CULTURE: Culture: NO GROWTH

## 2020-05-04 LAB — LACTATE DEHYDROGENASE: LDH: 500 U/L — ABNORMAL HIGH (ref 98–192)

## 2020-05-04 LAB — TRIGLYCERIDES: Triglycerides: 631 mg/dL — ABNORMAL HIGH (ref <150)

## 2020-05-04 LAB — MAGNESIUM: Magnesium: 1.9 mg/dL (ref 1.7–2.4)

## 2020-05-04 LAB — PHOSPHORUS: Phosphorus: 3 mg/dL (ref 2.5–4.6)

## 2020-05-04 MED ORDER — POTASSIUM CHLORIDE 20 MEQ PO PACK
40.0000 meq | PACK | Freq: Two times a day (BID) | ORAL | Status: AC
  Administered 2020-05-04 (×2): 40 meq via ORAL
  Filled 2020-05-04 (×2): qty 2

## 2020-05-04 MED ORDER — MAGNESIUM SULFATE 2 GM/50ML IV SOLN
2.0000 g | Freq: Once | INTRAVENOUS | Status: AC
  Administered 2020-05-04: 2 g via INTRAVENOUS
  Filled 2020-05-04: qty 50

## 2020-05-04 MED ORDER — SENNOSIDES 8.8 MG/5ML PO SYRP
15.0000 mL | ORAL_SOLUTION | Freq: Once | ORAL | Status: AC
  Administered 2020-05-04: 15 mL
  Filled 2020-05-04: qty 15

## 2020-05-04 MED ORDER — SORBITOL 70 % SOLN
60.0000 mL | Freq: Once | Status: AC
  Administered 2020-05-04: 60 mL
  Filled 2020-05-04: qty 60

## 2020-05-04 MED ORDER — FUROSEMIDE 10 MG/ML IJ SOLN
80.0000 mg | Freq: Once | INTRAMUSCULAR | Status: DC
  Filled 2020-05-04: qty 8

## 2020-05-04 MED ORDER — POLYETHYLENE GLYCOL 3350 17 G PO PACK
17.0000 g | PACK | Freq: Two times a day (BID) | ORAL | Status: DC
  Administered 2020-05-04 – 2020-05-05 (×4): 17 g
  Filled 2020-05-04 (×4): qty 1

## 2020-05-04 MED ORDER — BISACODYL 10 MG RE SUPP: 10.0000 mg | Freq: Every day | RECTAL | Status: DC | PRN

## 2020-05-04 MED ORDER — DEXMEDETOMIDINE HCL IN NACL 400 MCG/100ML IV SOLN
0.0000 ug/kg/h | INTRAVENOUS | Status: DC
  Administered 2020-05-04: 0.8 ug/kg/h via INTRAVENOUS
  Administered 2020-05-04: 1 ug/kg/h via INTRAVENOUS
  Administered 2020-05-04: 0.6 ug/kg/h via INTRAVENOUS
  Administered 2020-05-05 (×2): 1 ug/kg/h via INTRAVENOUS
  Administered 2020-05-05 (×5): 1.2 ug/kg/h via INTRAVENOUS
  Administered 2020-05-06: 1.1 ug/kg/h via INTRAVENOUS
  Administered 2020-05-06: 1.2 ug/kg/h via INTRAVENOUS
  Filled 2020-05-04 (×12): qty 100

## 2020-05-04 MED ORDER — FUROSEMIDE 10 MG/ML IJ SOLN
80.0000 mg | Freq: Two times a day (BID) | INTRAMUSCULAR | Status: AC
  Administered 2020-05-04 (×2): 80 mg via INTRAVENOUS
  Filled 2020-05-04: qty 8

## 2020-05-04 MED ORDER — METOCLOPRAMIDE HCL 5 MG/ML IJ SOLN
5.0000 mg | Freq: Three times a day (TID) | INTRAMUSCULAR | Status: DC
  Administered 2020-05-04 – 2020-05-06 (×6): 5 mg via INTRAVENOUS
  Filled 2020-05-04 (×6): qty 2
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
## 2020-05-04 NOTE — Plan of Care (Signed)
  Problem: Safety: Goal: Non-violent Restraint(s) Outcome: Progressing   Problem: Education: Goal: Understanding of cardiac disease, CV risk reduction, and recovery process will improve Outcome: Progressing Goal: Understanding of medication regimen will improve Outcome: Progressing Goal: Individualized Educational Video(s) Outcome: Progressing   Problem: Activity: Goal: Ability to tolerate increased activity will improve Outcome: Progressing   Problem: Cardiac: Goal: Ability to achieve and maintain adequate cardiopulmonary perfusion will improve Outcome: Progressing Goal: Vascular access site(s) Level 0-1 will be maintained Outcome: Progressing   Problem: Health Behavior/Discharge Planning: Goal: Ability to safely manage health-related needs after discharge will improve Outcome: Progressing   Problem: Education: Goal: Knowledge of General Education information will improve Description: Including pain rating scale, medication(s)/side effects and non-pharmacologic comfort measures Outcome: Progressing   Problem: Health Behavior/Discharge Planning: Goal: Ability to manage health-related needs will improve Outcome: Progressing   Problem: Clinical Measurements: Goal: Ability to maintain clinical measurements within normal limits will improve Outcome: Progressing Goal: Will remain free from infection Outcome: Progressing Goal: Diagnostic test results will improve Outcome: Progressing Goal: Respiratory complications will improve Outcome: Progressing Goal: Cardiovascular complication will be avoided Outcome: Progressing   Problem: Activity: Goal: Risk for activity intolerance will decrease Outcome: Progressing   Problem: Nutrition: Goal: Adequate nutrition will be maintained Outcome: Progressing

## 2020-05-04 NOTE — Progress Notes (Addendum)
Patient ID: Rodrickus Min, male   DOB: [birth year], 45 y.o.   MRN: [id number]     Advanced Heart Failure Rounding Note  PCP-Cardiologist: No primary care provider on file.   Subjective:    Patient is currently on dobutamine 2.5   Impella/Swan removed 4/25.   Febrile, on cefepime/vanc. Remains intubated. FIO2 60% . Copious secretions.   Follows commands on vent.   Echo: EF 30-35% with apical akinesis.  Repeat echo (4/25): EF >60%, normal RV   Objective:   Weight Range: 107.2 kg Body mass index is 37.02 kg/m.   Vital Signs:   Temp:  [99.5 F (37.5 C)-101.66 F (38.7 C)] 101.3 F (38.5 C) (04/26 0545) Pulse Rate:  [89-110] 100 (04/26 0645) Resp:  [21-30] 26 (04/26 0645) BP: (90-111)/(56-78) 105/63 (04/25 1546) SpO2:  [86 %-100 %] 97 % (04/26 0645) Arterial Line BP: (92-143)/(50-73) 106/57 (04/26 0645) FiO2 (%):  [40 %-100 %] 70 % (04/26 0645) Weight:  [107.2 kg] 107.2 kg (04/26 0500) Last BM Date:  (PTA)  Weight change: Filed Weights   05/02/20 0100 05/03/20 0500 05/04/20 0500  Weight: 101.9 kg 100.6 kg 107.2 kg    Intake/Output:   Intake/Output Summary (Last 24 hours) at 05/04/2020 0714 Last data filed at 05/04/2020 0600 Gross per 24 hour  Intake 3578 ml  Output 1860 ml  Net 1718 ml      Physical Exam  CVP 14  General:  Intubated.  HEENT: ETT Neck: supple. JVP hard to assess.  Carotids 2+ bilat; no bruits. No lymphadenopathy or thryomegaly appreciated. Cor: PMI nondisplaced. Regular rate & rhythm. No rubs, gallops or murmurs. Lungs: Coarse throughout Abdomen: soft, nontender, distended. No hepatosplenomegaly. No bruits or masses.  Hypoactive bowel sounds.  Extremities: no cyanosis, clubbing, rash, edema. RUE PICC. R femoral small hematoma.   Neuro: Intubated. MAE x4.  GU: Foley   Telemetry   NSR 90-100s personally reviewed.    Labs    CBC Recent Labs    05/01/20 2104 05/01/20 2113 05/03/20 0330 05/03/20 0334 05/04/20 0359 05/04/20 0400  05/04/20 0535  WBC 19.2*   < > 11.0*  --  10.9*  --   --   NEUTROABS 11.7*  --   --   --   --   --   --   HGB 14.3  14.6   < > 11.5*   < > 9.9* 9.9* 9.5*  HCT 43.5  43.0   < > 33.2*   < > 28.9* 29.0* 28.0*  MCV 96.0   < > 93.3  --  92.6  --   --   PLT 303   < > 228  --  176  --   --    < > = values in this interval not displayed.   Basic Metabolic Panel Recent Labs    05/03/20 0330 05/03/20 0334 05/04/20 0359 05/04/20 0400 05/04/20 0535  NA 132*   < > 132* 134* 133*  K 3.4*   < > 4.1 3.8 3.7  CL 102  --  103  --   --   CO2 20*  --  22  --   --   GLUCOSE 131*  --  112*  --   --   BUN 12  --  15  --   --   CREATININE 1.35*  --  1.30*  --   --   CALCIUM 8.1*  --  8.1*  --   --   MG 2.0  --  1.9  --   --  PHOS 3.4  --  3.0  --   --    < > = values in this interval not displayed.   Liver Function Tests Recent Labs    05/03/20 0330 05/04/20 0359  AST 98* 56*  ALT 88* 59*  ALKPHOS 61 64  BILITOT 0.7 1.3*  PROT 5.5* 5.3*  ALBUMIN 3.0* 2.5*   No results for input(s): LIPASE, AMYLASE in the last 72 hours. Cardiac Enzymes No results for input(s): CKTOTAL, CKMB, CKMBINDEX, TROPONINI in the last 72 hours.  BNP: BNP (last 3 results) No results for input(s): BNP in the last 8760 hours.  ProBNP (last 3 results) No results for input(s): PROBNP in the last 8760 hours.   D-Dimer No results for input(s): DDIMER in the last 72 hours. Hemoglobin A1C Recent Labs    05/01/20 2104  HGBA1C 6.1*   Fasting Lipid Panel Recent Labs    05/01/20 2104 05/03/20 0330 05/04/20 0359  CHOL 242*  --   --   HDL 29*  --   --   LDLCALC UNABLE TO CALCULATE IF TRIGLYCERIDE OVER 400 mg/dL  --   --   TRIG 469*   < > 631*  CHOLHDL 8.3  --   --   LDLDIRECT 106.8*  --   --    < > = values in this interval not displayed.   Thyroid Function Tests No results for input(s): TSH, T4TOTAL, T3FREE, THYROIDAB in the last 72 hours.  Invalid input(s): FREET3  Other results:   Imaging     DG CHEST PORT 1 VIEW  Result Date: 05/04/2020 CLINICAL DATA:  Intubation.  Respiratory failure. EXAM: PORTABLE CHEST 1 VIEW COMPARISON:  05/03/2020. FINDINGS: Interim removal of Impella device and Swan-Ganz catheter. Right PICC line noted with its tip over the SVC. Endotracheal tube and NG tube in stable position. Heart size normal. Low lung volumes with bibasilar and right upper lung atelectatic changes. Mild bibasilar infiltrates cannot be excluded. Tiny right pleural effusion cannot be excluded. No pneumothorax. IMPRESSION: 1. Interim removal of Impella device and Swan-Ganz catheter. Right PICC line noted with its tip over the SVC. Endotracheal tube and NG tube in stable position. 2. Low lung volumes with bibasilar and right upper lung atelectatic changes. Mild bibasilar infiltrates cannot be excluded. Tiny right pleural effusion cannot be excluded. Electronically Signed   By: Marcello Moores  Register   On: 05/04/2020 06:01   ECHOCARDIOGRAM LIMITED  Result Date: 05/03/2020    ECHOCARDIOGRAM LIMITED REPORT   Patient Name:   GARREN GREENMAN Date of Exam: 05/03/2020 Medical Rec #:  [id number]         Height:       67.0 in Accession #:    [id number]        Weight:       221.8 lb Date of Birth:  11-05-1975          BSA:          2.113 m Patient Age:    64 years          BP:           88/61 mmHg Patient Gender: M                 HR:           102 bpm. Exam Location:  Inpatient Procedure: Limited Echo Indications:    CHF-Acute Systolic F02.77  History:        Patient has prior history of Echocardiogram examinations, most  recent 05/02/2020. CAD and Acute MI; Risk Factors:Dyslipidemia                 and Current Smoker. Cardiac arrest: VF due to STEMI. Cardiogenic                 shock. Decrease Impella to P5 today.  Sonographer:    Darlina Sicilian RDCS Referring Phys: Cotton  1. Impella cateter noted across the aortic valve and into the left ventricle. 6.5 cm from the aortic  annulus. Impella tip terminates distal in the left ventricular carvity, 2.6 cm from the apex. Compared with the echo yesterday (2/50/03) systolic function has improved significantly. Left ventricular ejection fraction, by estimation, is 65 to 70%. The left ventricle has normal function. The left ventricle has no regional wall motion abnormalities.  2. Right ventricular systolic function is normal. The right ventricular size is normal.  3. The mitral valve is grossly normal.  4. The aortic valve was not well visualized. FINDINGS  Left Ventricle: Impella cateter noted across the aortic valve and into the left ventricle. 6.5 cm from the aortic annulus. Impella tip terminates distal in the left ventricular carvity, 2.6 cm from the apex. Compared with the echo yesterday (07/13/86) systolic function has improved significantly. Left ventricular ejection fraction, by estimation, is 65 to 70%. The left ventricle has normal function. The left ventricle has no regional wall motion abnormalities. The left ventricular internal cavity size  was normal in size. Right Ventricle: The right ventricular size is normal. No increase in right ventricular wall thickness. Right ventricular systolic function is normal. Left Atrium: Left atrial size was normal in size. Right Atrium: Right atrial size was normal in size. Pericardium: There is no evidence of pericardial effusion. Mitral Valve: The mitral valve is grossly normal. Tricuspid Valve: The tricuspid valve is not assessed. Aortic Valve: The aortic valve was not well visualized. Pulmonic Valve: The pulmonic valve was not assessed. Skeet Latch MD Electronically signed by Skeet Latch MD Signature Date/Time: 05/03/2020/12:15:22 PM    Final    [place] EKG SITE RITE  Result Date: 05/03/2020 If Site Rite image not attached, placement could not be confirmed due to current cardiac rhythm.    Medications:     Scheduled Medications: . aspirin  81 mg Per Tube Daily  . atorvastatin   80 mg Per Tube Daily  . chlorhexidine gluconate (MEDLINE KIT)  15 mL Mouth Rinse BID  . Chlorhexidine Gluconate Cloth  6 each Topical Daily  . docusate  100 mg Per Tube BID  . feeding supplement (PROSource TF)  45 mL Per Tube QID  . fentaNYL (SUBLIMAZE) injection  50 mcg Intravenous Once  . insulin aspart  0-15 Units Subcutaneous Q4H  . mouth rinse  15 mL Mouth Rinse 10 times per day  . pantoprazole (PROTONIX) IV  40 mg Intravenous Daily  . polyethylene glycol  17 g Per Tube Daily  . sodium chloride flush  10-40 mL Intracatheter Q12H  . sodium chloride flush  10-40 mL Intracatheter Q12H  . sodium chloride flush  3 mL Intravenous Q12H  . ticagrelor  90 mg Per Tube BID    Infusions: . sodium chloride    . sodium chloride 10 mL/hr at 05/04/20 0600  . amiodarone 30 mg/hr (05/04/20 0600)  . ceFEPime (MAXIPIME) IV Stopped (05/04/20 0551)  . DOBUTamine 2.5 mcg/kg/min (05/04/20 0600)  . feeding supplement (VITAL 1.5 CAL) 30 mL/hr at 05/04/20 0657  . fentaNYL infusion INTRAVENOUS 200  mcg/hr (05/04/20 0600)  . impella catheter heparin 50 unit/mL in dextrose 5%    . midazolam 4 mg/hr (05/04/20 0600)  . norepinephrine (LEVOPHED) Adult infusion Stopped (05/04/20 0331)  . propofol (DIPRIVAN) infusion Stopped (05/03/20 0646)  . vancomycin Stopped (05/04/20 0414)    PRN Medications: sodium chloride, sodium chloride, sodium chloride, acetaminophen, fentaNYL, midazolam, nitroGLYCERIN, ondansetron (ZOFRAN) IV, sodium chloride flush, sodium chloride flush, sodium chloride flush   Assessment/Plan   1. CAD: Anterior STEMI.  Occlusion of distal/apical LAD on cath.  Small caliber vessel, no intervention.  - Continue ASA 81 and Brilinta 90 bid.  - Continue statin.  2. Cardiac arrest: VF due to STEMI.  Rhythm stable here.  - Continue amiodarone for now.  - ICD for secondary prevention prior to discharge.  Give 2 gram mag . Keep mag >2 and K >4  3. Cardiogenic shock:  Echo with EF 30-35%, apical  akinesis => repeat improved to >60%.   Impella out 4/25  Remains on dobutamine 2.5. CO-OX pending.  - CVP 14. Give 80 mg IV lasix bid today.  4. AKI: Creatinine 1.3 today.   5. ID: Remains febrile.    ?bibasilar infiltrates on CXR.   - Cultures 4/25. Blood CX - NGTD.  - Sputum - GNR and GPC - Started  Vancomycin/cefepime on 4/25 .   6. Abd Distended Give dose sorbitol. Hypoactive bowel sounds.   Length of Stay: 3  Amy Clegg, NP  05/04/2020, 7:14 AM  Advanced Heart Failure Team Pager [phone] (M-F; 7a - 5p)  Please contact [facility] for night-coverage after hours (5p -7a ) and weekends on [email or web address]  Patient seen with NP, agree with the above note.   He remains intubated with copious secretions, febrile to 101.3.  FiO2 0.6.   Repeat echo yesterday with EF up to >60%.  Impella/Swan out.  CVP 14 today, co-ox pending.  He is off norepinephrine and remains on dobutamine 2.5.    General: Intubated Neck: Thick, JVP 10, no thyromegaly or thyroid nodule.  Lungs: Decreased BS at bases CV: Nondisplaced PMI.  Heart regular S1/S2, no S3/S4, no murmur.  No peripheral edema.    Abdomen: Soft, nontender, no hepatosplenomegaly, no distention.  Skin: Intact without lesions or rashes.  Neurologic: Awake, follows commands.  Extremities: No clubbing or cyanosis.  HEENT: Normal.   Continue empiric vanc/cefepime, ?aspiration PNA.   Recovery of EF, suspect drop was due to stunning in setting of arrest.  Off norepinephrine, can decrease dobutamine to 1.  Volume overload, will give Lasix 80 mg IV bid today.   Will need secondary prevention ICD after discharge.   CRITICAL CARE Performed by: Loralie Champagne  Total critical care time: 35 minutes  Critical care time was exclusive of separately billable procedures and treating other patients.  Critical care was necessary to treat or prevent imminent or life-threatening deterioration.  Critical care was time spent personally by me on the  following activities: development of treatment plan with patient and/or surrogate as well as nursing, discussions with consultants, evaluation of patient's response to treatment, examination of patient, obtaining history from patient or surrogate, ordering and performing treatments and interventions, ordering and review of laboratory studies, ordering and review of radiographic studies, pulse oximetry and re-evaluation of patient's condition.  Loralie Champagne 05/04/2020 7:43 AM

## 2020-05-04 NOTE — Progress Notes (Signed)
eLink Physician-Brief Progress Note Patient Name: Tyler Jacobson DOB: 03-Mar-1975 MRN: [id number]   Date of Service  05/04/2020  HPI/Events of Note  Patient desaturated a short while ago and had copious secretions suctioned from the airway, there has been some recovery with PEEP increased to 12 and FIO2 switched to 100 % oxygen.  eICU Interventions  Aggressive Pulmonary toilet, wean FIO2 as tolerated then wean PEEP, patient may benefit from exchanging his 7.0 tube for an 8.5 size ET tube to better facilitate pulmonary toilet. Portable CXR and ABG result reviewed.        Thomasene Lot Cinde Ebert 05/04/2020, 6:21 AM

## 2020-05-04 NOTE — Progress Notes (Signed)
Patient had coughing episode with copious amounts of the thick tan/yellow sputum. Patients Sp02 fell to 82-85% despite being placed on 100% fio2. Increased patients peep to 12cm to maintain Sp02 level greater than 92%.

## 2020-05-05 DIAGNOSIS — I2102 ST elevation (STEMI) myocardial infarction involving left anterior descending coronary artery: Secondary | ICD-10-CM | POA: Diagnosis not present

## 2020-05-05 DIAGNOSIS — R570 Cardiogenic shock: Secondary | ICD-10-CM | POA: Diagnosis not present

## 2020-05-05 LAB — CBC
HCT: 26 % — ABNORMAL LOW (ref 39.0–52.0)
Hemoglobin: 8.7 g/dL — ABNORMAL LOW (ref 13.0–17.0)
MCH: 31.5 pg (ref 26.0–34.0)
MCHC: 33.5 g/dL (ref 30.0–36.0)
MCV: 94.2 fL (ref 80.0–100.0)
Platelets: 185 10*3/uL (ref 150–400)
RBC: 2.76 MIL/uL — ABNORMAL LOW (ref 4.22–5.81)
RDW: 13.7 % (ref 11.5–15.5)
WBC: 9.5 10*3/uL (ref 4.0–10.5)
nRBC: 0 % (ref 0.0–0.2)

## 2020-05-05 LAB — GLUCOSE, CAPILLARY
Glucose-Capillary: 148 mg/dL — ABNORMAL HIGH (ref 70–99)
Glucose-Capillary: 152 mg/dL — ABNORMAL HIGH (ref 70–99)
Glucose-Capillary: 116 mg/dL — ABNORMAL HIGH (ref 70–99)
Glucose-Capillary: 165 mg/dL — ABNORMAL HIGH (ref 70–99)
Glucose-Capillary: 118 mg/dL — ABNORMAL HIGH (ref 70–99)

## 2020-05-05 LAB — COOXEMETRY PANEL
Carboxyhemoglobin: 0.9 % (ref 0.5–1.5)
Methemoglobin: 1.3 % (ref 0.0–1.5)
O2 Saturation: 66.7 %
Total hemoglobin: 9 g/dL — ABNORMAL LOW (ref 12.0–16.0)

## 2020-05-05 LAB — CULTURE, RESPIRATORY W GRAM STAIN

## 2020-05-05 LAB — COMPREHENSIVE METABOLIC PANEL
ALT: 43 U/L (ref 0–44)
AST: 30 U/L (ref 15–41)
Albumin: 2.6 g/dL — ABNORMAL LOW (ref 3.5–5.0)
Alkaline Phosphatase: 75 U/L (ref 38–126)
Anion gap: 10 (ref 5–15)
BUN: 22 mg/dL — ABNORMAL HIGH (ref 6–20)
CO2: 21 mmol/L — ABNORMAL LOW (ref 22–32)
Calcium: 7.7 mg/dL — ABNORMAL LOW (ref 8.9–10.3)
Chloride: 105 mmol/L (ref 98–111)
Creatinine, Ser: 1.26 mg/dL — ABNORMAL HIGH (ref 0.61–1.24)
GFR, Estimated: >60 mL/min (ref >60)
Glucose, Bld: 186 mg/dL — ABNORMAL HIGH (ref 70–99)
Potassium: 3.1 mmol/L — ABNORMAL LOW (ref 3.5–5.1)
Sodium: 136 mmol/L (ref 135–145)
Total Bilirubin: 0.9 mg/dL (ref 0.3–1.2)
Total Protein: 5.5 g/dL — ABNORMAL LOW (ref 6.5–8.1)

## 2020-05-05 LAB — MAGNESIUM: Magnesium: 2.1 mg/dL (ref 1.7–2.4)

## 2020-05-05 LAB — PHOSPHORUS: Phosphorus: 2.4 mg/dL — ABNORMAL LOW (ref 2.5–4.6)

## 2020-05-05 LAB — TRIGLYCERIDES: Triglycerides: 432 mg/dL — ABNORMAL HIGH (ref <150)

## 2020-05-05 MED ORDER — AMIODARONE HCL 200 MG PO TABS
200.0000 mg | ORAL_TABLET | Freq: Two times a day (BID) | ORAL | Status: DC
  Administered 2020-05-05: 200 mg
  Filled 2020-05-05: qty 1

## 2020-05-05 MED ORDER — CLONAZEPAM 0.5 MG PO TABS
0.5000 mg | ORAL_TABLET | Freq: Three times a day (TID) | ORAL | Status: DC
  Administered 2020-05-05: 0.5 mg via ORAL
  Filled 2020-05-05: qty 1

## 2020-05-05 MED ORDER — ALTEPLASE 2 MG IJ SOLR
2.0000 mg | Freq: Once | INTRAMUSCULAR | Status: AC
  Administered 2020-05-05: 2 mg
  Filled 2020-05-05: qty 2

## 2020-05-05 MED ORDER — ARTIFICIAL TEARS OPHTHALMIC OINT
TOPICAL_OINTMENT | OPHTHALMIC | Status: DC | PRN
  Filled 2020-05-05: qty 3.5

## 2020-05-05 MED ORDER — POTASSIUM CHLORIDE 20 MEQ PO PACK
40.0000 meq | PACK | Freq: Two times a day (BID) | ORAL | Status: DC
  Administered 2020-05-05: 40 meq via ORAL
  Filled 2020-05-05: qty 2

## 2020-05-05 MED ORDER — QUETIAPINE FUMARATE 25 MG PO TABS
25.0000 mg | ORAL_TABLET | Freq: Two times a day (BID) | ORAL | Status: DC
  Administered 2020-05-05: 25 mg
  Filled 2020-05-05: qty 1

## 2020-05-05 MED ORDER — POTASSIUM CHLORIDE 20 MEQ PO PACK
40.0000 meq | PACK | Freq: Two times a day (BID) | ORAL | Status: AC
  Administered 2020-05-05: 40 meq via ORAL
  Filled 2020-05-05: qty 2

## 2020-05-05 MED ORDER — CLONAZEPAM 0.1 MG/ML ORAL SUSPENSION: 0.5000 mg | Freq: Three times a day (TID) | ORAL | Status: DC

## 2020-05-05 MED ORDER — MIDAZOLAM HCL 2 MG/2ML IJ SOLN
2.0000 mg | INTRAMUSCULAR | Status: DC | PRN
  Administered 2020-05-05 – 2020-05-06 (×3): 2 mg via INTRAVENOUS
  Filled 2020-05-05 (×3): qty 2

## 2020-05-05 MED ORDER — QUETIAPINE FUMARATE 25 MG PO TABS
25.0000 mg | ORAL_TABLET | Freq: Two times a day (BID) | ORAL | Status: DC
  Administered 2020-05-05: 25 mg via ORAL
  Filled 2020-05-05: qty 1

## 2020-05-05 MED ORDER — POTASSIUM PHOSPHATES 15 MMOLE/5ML IV SOLN
30.0000 mmol | Freq: Once | INTRAVENOUS | Status: AC
  Administered 2020-05-05: 30 mmol via INTRAVENOUS
  Filled 2020-05-05: qty 10

## 2020-05-05 MED ORDER — CLONAZEPAM 0.5 MG PO TABS
0.5000 mg | ORAL_TABLET | Freq: Three times a day (TID) | ORAL | Status: DC
  Administered 2020-05-05: 0.5 mg
  Filled 2020-05-05: qty 1

## 2020-05-05 MED ORDER — PANTOPRAZOLE SODIUM 40 MG PO PACK: 40.0000 mg | PACK | Freq: Every day | ORAL | Status: DC

## 2020-05-05 MED ORDER — SODIUM CHLORIDE 0.9 % IV SOLN
2.0000 g | INTRAVENOUS | Status: AC
  Administered 2020-05-05 – 2020-05-09 (×5): 2 g via INTRAVENOUS
  Filled 2020-05-05: qty 2
  Filled 2020-05-05 (×3): qty 20
  Filled 2020-05-05: qty 2

## 2020-05-05 MED ORDER — AMIODARONE HCL 200 MG PO TABS
200.0000 mg | ORAL_TABLET | Freq: Two times a day (BID) | ORAL | Status: DC
  Administered 2020-05-05: 200 mg via ORAL
  Filled 2020-05-05: qty 1

## 2020-05-05 MED ORDER — OXYCODONE HCL 5 MG/5ML PO SOLN
5.0000 mg | ORAL | Status: DC | PRN
  Administered 2020-05-05 (×2): 5 mg
  Filled 2020-05-05 (×2): qty 5

## 2020-05-05 MED ORDER — FUROSEMIDE 10 MG/ML IJ SOLN
80.0000 mg | Freq: Two times a day (BID) | INTRAMUSCULAR | Status: AC
  Administered 2020-05-05 (×2): 80 mg via INTRAVENOUS
  Filled 2020-05-05 (×2): qty 8

## 2020-05-05 MED ORDER — CHLORHEXIDINE GLUCONATE 0.12 % MT SOLN
OROMUCOSAL | Status: AC
  Administered 2020-05-05: 15 mL via OROMUCOSAL
  Filled 2020-05-05: qty 15

## 2020-05-05 NOTE — Progress Notes (Signed)
Patient ID: Tyler Jacobson, male   DOB: 1975-01-26, 45 y.o.   MRN: [id number]     Advanced Heart Failure Rounding Note  PCP-Cardiologist: No primary care provider on file.   Subjective:    Patient is currently on dobutamine 1, co-ox 67%.  I/Os net -1463.  CVP 12-14.   Impella/Swan removed 4/25.   Afebrile, on cefepime/vanc. Remains intubated. FIO2 50%. Trach aspirate growing H influenzae.    Follows commands on vent.   Echo: EF 30-35% with apical akinesis.  Repeat echo (4/25): EF >60%, normal RV   Objective:   Weight Range: 104.1 kg Body mass index is 35.94 kg/m.   Vital Signs:   Temp:  [98.7 F (37.1 C)-100 F (37.8 C)] 99.3 F (37.4 C) (04/27 0335) Pulse Rate:  [68-99] 70 (04/27 0700) Resp:  [24-26] 26 (04/27 0700) BP: (93-119)/(61-74) 112/72 (04/27 0700) SpO2:  [92 %-100 %] 97 % (04/27 0700) Arterial Line BP: (85-138)/(44-72) 122/64 (04/27 0700) FiO2 (%):  [40 %-50 %] 50 % (04/27 0400) Weight:  [104.1 kg] 104.1 kg (04/27 0300) Last BM Date:  (pta)  Weight change: Filed Weights   05/03/20 0500 05/04/20 0500 05/05/20 0300  Weight: 100.6 kg 107.2 kg 104.1 kg    Intake/Output:   Intake/Output Summary (Last 24 hours) at 05/05/2020 0736 Last data filed at 05/05/2020 0700 Gross per 24 hour  Intake 2147.36 ml  Output 3610 ml  Net -1462.64 ml      Physical Exam  CVP 12-14 General: Intubated Neck: JVP 10+, no thyromegaly or thyroid nodule.  Lungs: Decreased at bases.  CV: Nondisplaced PMI.  Heart regular S1/S2, no S3/S4, no murmur.  No peripheral edema.  Abdomen: Soft, nontender, no hepatosplenomegaly, no distention.  Skin: Intact without lesions or rashes.  Neurologic: Awake, follows commands.  Extremities: No clubbing or cyanosis.  HEENT: Normal.    Telemetry   NSR 70s personally reviewed.    Labs    CBC Recent Labs    05/04/20 0359 05/04/20 0400 05/04/20 0535 05/05/20 0235  WBC 10.9*  --   --  9.5  HGB 9.9*   < > 9.5* 8.7*  HCT 28.9*    < > 28.0* 26.0*  MCV 92.6  --   --  94.2  PLT 176  --   --  185   < > = values in this interval not displayed.   Basic Metabolic Panel Recent Labs    05/03/20 0330 05/03/20 0334 05/04/20 0359 05/04/20 0400 05/04/20 0535  NA 132*   < > 132* 134* 133*  K 3.4*   < > 4.1 3.8 3.7  CL 102  --  103  --   --   CO2 20*  --  22  --   --   GLUCOSE 131*  --  112*  --   --   BUN 12  --  15  --   --   CREATININE 1.35*  --  1.30*  --   --   CALCIUM 8.1*  --  8.1*  --   --   MG 2.0  --  1.9  --   --   PHOS 3.4  --  3.0  --   --    < > = values in this interval not displayed.   Liver Function Tests Recent Labs    05/03/20 0330 05/04/20 0359  AST 98* 56*  ALT 88* 59*  ALKPHOS 61 64  BILITOT 0.7 1.3*  PROT 5.5* 5.3*  ALBUMIN 3.0* 2.5*  No results for input(s): LIPASE, AMYLASE in the last 72 hours. Cardiac Enzymes No results for input(s): CKTOTAL, CKMB, CKMBINDEX, TROPONINI in the last 72 hours.  BNP: BNP (last 3 results) No results for input(s): BNP in the last 8760 hours.  ProBNP (last 3 results) No results for input(s): PROBNP in the last 8760 hours.   D-Dimer No results for input(s): DDIMER in the last 72 hours. Hemoglobin A1C No results for input(s): HGBA1C in the last 72 hours. Fasting Lipid Panel Recent Labs    05/04/20 0359  TRIG 631*   Thyroid Function Tests No results for input(s): TSH, T4TOTAL, T3FREE, THYROIDAB in the last 72 hours.  Invalid input(s): FREET3  Other results:   Imaging    No results found.   Medications:     Scheduled Medications: . aspirin  81 mg Per Tube Daily  . atorvastatin  80 mg Per Tube Daily  . chlorhexidine gluconate (MEDLINE KIT)  15 mL Mouth Rinse BID  . Chlorhexidine Gluconate Cloth  6 each Topical Daily  . docusate  100 mg Per Tube BID  . feeding supplement (PROSource TF)  45 mL Per Tube QID  . fentaNYL (SUBLIMAZE) injection  50 mcg Intravenous Once  . furosemide  80 mg Intravenous BID  . insulin aspart  0-15  Units Subcutaneous Q4H  . mouth rinse  15 mL Mouth Rinse 10 times per day  . metoCLOPramide (REGLAN) injection  5 mg Intravenous Q8H  . pantoprazole (PROTONIX) IV  40 mg Intravenous Daily  . polyethylene glycol  17 g Per Tube BID  . sodium chloride flush  10-40 mL Intracatheter Q12H  . sodium chloride flush  10-40 mL Intracatheter Q12H  . sodium chloride flush  3 mL Intravenous Q12H  . ticagrelor  90 mg Per Tube BID    Infusions: . sodium chloride    . sodium chloride Stopped (05/04/20 1212)  . amiodarone 30 mg/hr (05/05/20 0700)  . ceFEPime (MAXIPIME) IV 200 mL/hr at 05/05/20 0700  . dexmedetomidine (PRECEDEX) IV infusion 1 mcg/kg/hr (05/05/20 0700)  . feeding supplement (VITAL 1.5 CAL) 55 mL/hr at 05/04/20 2000  . fentaNYL infusion INTRAVENOUS 200 mcg/hr (05/05/20 0700)  . norepinephrine (LEVOPHED) Adult infusion Stopped (05/04/20 1359)  . vancomycin Stopped (05/05/20 0340)    PRN Medications: sodium chloride, sodium chloride, acetaminophen, bisacodyl, fentaNYL, nitroGLYCERIN, sodium chloride flush, sodium chloride flush, sodium chloride flush   Assessment/Plan   1. CAD: Anterior STEMI.  Occlusion of distal/apical LAD on cath.  Small caliber vessel, no intervention.  - Continue ASA 81 and Brilinta 90 bid.  - Continue statin.  2. Cardiac arrest: VF due to STEMI.  Rhythm stable here.  - Transition amiodarone to 200 mg po bid.   - ICD for secondary prevention prior to discharge.  3. Cardiogenic shock:  Echo with EF 30-35%, apical akinesis => repeat improved to >60%.  Impella out 4/25  Remains on dobutamine 1. CO-OX 67%.  CVP 12-14, good diuresis yesterday.   - Lasix 80 mg IV bid x 2 doses today.  Pending today's BMET for K and creatinine. - d/c dobutamine.   4. AKI: Creatinine has trended down but pending today.   5. ID: Afebrile.  PNA with H influenzae on trach aspirate.  - Started  Vancomycin/cefepime on 4/25, can narrow per CCM.   6. Acute hypoxemic respiratory failure:  Awake on vent this morning, hopefully can extubate.   CRITICAL CARE Performed by: Loralie Champagne  Total critical care time: 35 minutes  Critical care  time was exclusive of separately billable procedures and treating other patients.  Critical care was necessary to treat or prevent imminent or life-threatening deterioration.  Critical care was time spent personally by me on the following activities: development of treatment plan with patient and/or surrogate as well as nursing, discussions with consultants, evaluation of patient's response to treatment, examination of patient, obtaining history from patient or surrogate, ordering and performing treatments and interventions, ordering and review of laboratory studies, ordering and review of radiographic studies, pulse oximetry and re-evaluation of patient's condition.  Loralie Champagne 05/05/2020 7:36 AM
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
## 2020-05-06 ENCOUNTER — Other Ambulatory Visit (HOSPITAL_COMMUNITY): Payer: Self-pay

## 2020-05-06 DIAGNOSIS — R570 Cardiogenic shock: Secondary | ICD-10-CM | POA: Diagnosis not present

## 2020-05-06 DIAGNOSIS — I2102 ST elevation (STEMI) myocardial infarction involving left anterior descending coronary artery: Secondary | ICD-10-CM | POA: Diagnosis not present

## 2020-05-06 LAB — COMPREHENSIVE METABOLIC PANEL
ALT: 60 U/L — ABNORMAL HIGH (ref 0–44)
AST: 58 U/L — ABNORMAL HIGH (ref 15–41)
Albumin: 2.7 g/dL — ABNORMAL LOW (ref 3.5–5.0)
Alkaline Phosphatase: 109 U/L (ref 38–126)
Anion gap: 10 (ref 5–15)
BUN: 26 mg/dL — ABNORMAL HIGH (ref 6–20)
CO2: 24 mmol/L (ref 22–32)
Calcium: 8.4 mg/dL — ABNORMAL LOW (ref 8.9–10.3)
Chloride: 104 mmol/L (ref 98–111)
Creatinine, Ser: 1.27 mg/dL — ABNORMAL HIGH (ref 0.61–1.24)
GFR, Estimated: >60 mL/min (ref >60)
Glucose, Bld: 111 mg/dL — ABNORMAL HIGH (ref 70–99)
Potassium: 3.1 mmol/L — ABNORMAL LOW (ref 3.5–5.1)
Sodium: 138 mmol/L (ref 135–145)
Total Bilirubin: 0.5 mg/dL (ref 0.3–1.2)
Total Protein: 6.3 g/dL — ABNORMAL LOW (ref 6.5–8.1)

## 2020-05-06 LAB — COOXEMETRY PANEL
Carboxyhemoglobin: 0.9 % (ref 0.5–1.5)
Methemoglobin: 1.1 % (ref 0.0–1.5)
O2 Saturation: 57.8 %
Total hemoglobin: 10.7 g/dL — ABNORMAL LOW (ref 12.0–16.0)

## 2020-05-06 LAB — GLUCOSE, CAPILLARY
Glucose-Capillary: 113 mg/dL — ABNORMAL HIGH (ref 70–99)
Glucose-Capillary: 114 mg/dL — ABNORMAL HIGH (ref 70–99)
Glucose-Capillary: 114 mg/dL — ABNORMAL HIGH (ref 70–99)
Glucose-Capillary: 119 mg/dL — ABNORMAL HIGH (ref 70–99)
Glucose-Capillary: 127 mg/dL — ABNORMAL HIGH (ref 70–99)
Glucose-Capillary: 136 mg/dL — ABNORMAL HIGH (ref 70–99)

## 2020-05-06 LAB — CBC
HCT: 25.5 % — ABNORMAL LOW (ref 39.0–52.0)
Hemoglobin: 8.8 g/dL — ABNORMAL LOW (ref 13.0–17.0)
MCH: 31.5 pg (ref 26.0–34.0)
MCHC: 34.5 g/dL (ref 30.0–36.0)
MCV: 91.4 fL (ref 80.0–100.0)
Platelets: 224 10*3/uL (ref 150–400)
RBC: 2.79 MIL/uL — ABNORMAL LOW (ref 4.22–5.81)
RDW: 13.5 % (ref 11.5–15.5)
WBC: 9.3 10*3/uL (ref 4.0–10.5)
nRBC: 0.2 % (ref 0.0–0.2)

## 2020-05-06 LAB — MAGNESIUM: Magnesium: 2.1 mg/dL (ref 1.7–2.4)

## 2020-05-06 LAB — PHOSPHORUS: Phosphorus: 3.4 mg/dL (ref 2.5–4.6)

## 2020-05-06 LAB — BASIC METABOLIC PANEL
Anion gap: 13 (ref 5–15)
BUN: 23 mg/dL — ABNORMAL HIGH (ref 6–20)
CO2: 24 mmol/L (ref 22–32)
Calcium: 8.4 mg/dL — ABNORMAL LOW (ref 8.9–10.3)
Chloride: 102 mmol/L (ref 98–111)
Creatinine, Ser: 1.13 mg/dL (ref 0.61–1.24)
GFR, Estimated: >60 mL/min (ref >60)
Glucose, Bld: 97 mg/dL (ref 70–99)
Potassium: 3.6 mmol/L (ref 3.5–5.1)
Sodium: 139 mmol/L (ref 135–145)

## 2020-05-06 MED ORDER — ATORVASTATIN CALCIUM 80 MG PO TABS
80.0000 mg | ORAL_TABLET | Freq: Every day | ORAL | Status: DC
  Administered 2020-05-06 – 2020-05-10 (×5): 80 mg via ORAL
  Filled 2020-05-06 (×5): qty 1

## 2020-05-06 MED ORDER — QUETIAPINE FUMARATE 50 MG PO TABS
25.0000 mg | ORAL_TABLET | Freq: Two times a day (BID) | ORAL | Status: DC
  Administered 2020-05-06 – 2020-05-09 (×7): 25 mg via ORAL
  Filled 2020-05-06 (×7): qty 1

## 2020-05-06 MED ORDER — CLONAZEPAM 0.5 MG PO TABS
0.5000 mg | ORAL_TABLET | Freq: Three times a day (TID) | ORAL | Status: DC
  Administered 2020-05-06 – 2020-05-10 (×13): 0.5 mg via ORAL
  Filled 2020-05-06 (×13): qty 1

## 2020-05-06 MED ORDER — LOSARTAN POTASSIUM 25 MG PO TABS
12.5000 mg | ORAL_TABLET | Freq: Every day | ORAL | Status: DC
  Administered 2020-05-06 – 2020-05-10 (×5): 12.5 mg via ORAL
  Filled 2020-05-06 (×5): qty 1

## 2020-05-06 MED ORDER — FUROSEMIDE 10 MG/ML IJ SOLN
80.0000 mg | Freq: Two times a day (BID) | INTRAMUSCULAR | Status: AC
  Administered 2020-05-06 (×2): 80 mg via INTRAVENOUS
  Filled 2020-05-06 (×2): qty 8

## 2020-05-06 MED ORDER — AMIODARONE HCL 200 MG PO TABS
200.0000 mg | ORAL_TABLET | Freq: Two times a day (BID) | ORAL | Status: DC
  Administered 2020-05-06 – 2020-05-10 (×9): 200 mg via ORAL
  Filled 2020-05-06 (×9): qty 1

## 2020-05-06 MED ORDER — CARVEDILOL 3.125 MG PO TABS
3.1250 mg | ORAL_TABLET | Freq: Two times a day (BID) | ORAL | Status: DC
  Administered 2020-05-06 – 2020-05-09 (×8): 3.125 mg via ORAL
  Filled 2020-05-06 (×8): qty 1

## 2020-05-06 MED ORDER — TICAGRELOR 90 MG PO TABS
90.0000 mg | ORAL_TABLET | Freq: Two times a day (BID) | ORAL | Status: DC
  Administered 2020-05-06 – 2020-05-07 (×3): 90 mg via ORAL
  Filled 2020-05-06 (×3): qty 1

## 2020-05-06 MED ORDER — DOCUSATE SODIUM 100 MG PO CAPS
100.0000 mg | ORAL_CAPSULE | Freq: Two times a day (BID) | ORAL | Status: DC
  Administered 2020-05-07 – 2020-05-10 (×4): 100 mg via ORAL
  Filled 2020-05-06 (×6): qty 1

## 2020-05-06 MED ORDER — POTASSIUM CHLORIDE 20 MEQ PO PACK
20.0000 meq | PACK | ORAL | Status: DC
Start: 2020-05-06 — End: 2020-05-06
  Administered 2020-05-06: 20 meq
  Filled 2020-05-06: qty 1

## 2020-05-06 MED ORDER — POTASSIUM CHLORIDE 20 MEQ PO PACK
40.0000 meq | PACK | ORAL | Status: AC
  Administered 2020-05-06 (×2): 40 meq via ORAL
  Filled 2020-05-06 (×2): qty 2

## 2020-05-06 MED ORDER — POTASSIUM CHLORIDE 10 MEQ/50ML IV SOLN
10.0000 meq | INTRAVENOUS | Status: AC
  Administered 2020-05-06 (×4): 10 meq via INTRAVENOUS
  Filled 2020-05-06 (×2): qty 50

## 2020-05-06 MED ORDER — ACETAMINOPHEN 325 MG PO TABS
650.0000 mg | ORAL_TABLET | ORAL | Status: DC | PRN
  Administered 2020-05-06 (×2): 650 mg via ORAL
  Filled 2020-05-06 (×2): qty 2

## 2020-05-06 MED ORDER — OXYCODONE HCL 5 MG PO TABS
5.0000 mg | ORAL_TABLET | ORAL | Status: DC | PRN
  Administered 2020-05-06 – 2020-05-09 (×10): 5 mg via ORAL
  Filled 2020-05-06 (×10): qty 1

## 2020-05-06 MED ORDER — POLYETHYLENE GLYCOL 3350 17 G PO PACK
17.0000 g | PACK | Freq: Two times a day (BID) | ORAL | Status: DC
  Administered 2020-05-07: 17 g via ORAL
  Filled 2020-05-06 (×5): qty 1

## 2020-05-06 MED ORDER — ASPIRIN 81 MG PO CHEW
81.0000 mg | CHEWABLE_TABLET | Freq: Every day | ORAL | Status: DC
  Administered 2020-05-06 – 2020-05-10 (×5): 81 mg via ORAL
  Filled 2020-05-06 (×5): qty 1

## 2020-05-06 MED ORDER — POTASSIUM CHLORIDE CRYS ER 20 MEQ PO TBCR
40.0000 meq | EXTENDED_RELEASE_TABLET | Freq: Once | ORAL | Status: AC
  Administered 2020-05-06: 40 meq via ORAL
  Filled 2020-05-06: qty 2

## 2020-05-06 MED ORDER — ADULT MULTIVITAMIN W/MINERALS CH
1.0000 | ORAL_TABLET | Freq: Every day | ORAL | Status: DC
  Administered 2020-05-06 – 2020-05-10 (×5): 1 via ORAL
  Filled 2020-05-06 (×5): qty 1

## 2020-05-06 MED ORDER — BOOST / RESOURCE BREEZE PO LIQD CUSTOM
1.0000 | Freq: Three times a day (TID) | ORAL | Status: DC
  Administered 2020-05-06 – 2020-05-09 (×9): 1 via ORAL

## 2020-05-06 NOTE — Progress Notes (Signed)
Nutrition Follow Up  DOCUMENTATION CODES:   Not applicable  INTERVENTION:    Boost Breeze po TID, each supplement provides 250 kcal and 9 grams of protein  MVI daily   NUTRITION DIAGNOSIS:   Inadequate oral intake related to acute illness as evidenced by NPO status.  Diet advanced   GOAL:   Patient will meet greater than or equal to 90% of their needs   Awaiting first meal  MONITOR:   Vent status,TF tolerance,Labs,Weight trends  REASON FOR ASSESSMENT:   Ventilator,Consult Enteral/tube feeding initiation and management  ASSESSMENT:   45 yo male admitted post VF arrest secondary to STEMI, cardiogenic shock requiring pressors, acute respiratory failure requiring intubation, AKI. PMH includes tobacco abuse   4/23 Impella placed, Intubated 4/25 Impella removed   Patient self extubated this am. Has some underlying confusion. Diet advanced to clears. Patient reports he is thirsty and is experiencing loose stools. RD to provide supplementation to maximize kcal and protein this admission.   Admission weight: 101.9 kg  Current weight: 102.9 kg   UOP: 4335 ml x 24 hrs   Medications: colace, 80 mg lasix BID, SS novolog, 5 mg reglan TID, miralax Labs: K 3.1 (L) LFTs slightly increased   Diet Order:   Diet Order            Diet clear liquid Room service appropriate? Yes; Fluid consistency: Thin  Diet effective now                 EDUCATION NEEDS:   Not appropriate for education at this time  Skin:  Skin Assessment: Reviewed RN Assessment  Last BM:  4/28  Height:   Ht Readings from Last 1 Encounters:  05/02/20 5\' 7"  (1.702 m)    Weight:   Wt Readings from Last 1 Encounters:  05/06/20 102.9 kg    BMI:  Body mass index is 35.53 kg/m.  Estimated Nutritional Needs:   Kcal:  2200-2400 kcal  Protein:  110-125 grams  Fluid:  >/= 2 L/day  05/08/20 RD, LDN Clinical Nutrition Pager listed in AMION

## 2020-05-06 NOTE — TOC Benefit Eligibility Note (Signed)
Patient Product/process development scientist completed.    The patient is currently admitted and upon discharge could be taking Entresto 24mg /26mg .  Requires Prior Authorization.   The patient is currently admitted and upon discharge could be taking Farxiga 10mg .  The current 30 day co-pay is, $10.00.   The patient is insured through    , CPhT Pharmacy Patient Advocate Specialist [facility] Antimicrobial Stewardship Team Direct Number: [phone]  Fax: [phone]

## 2020-05-06 NOTE — Progress Notes (Signed)
K+ 3.1 °Replaced per protocol  °

## 2020-05-06 NOTE — TOC Benefit Eligibility Note (Signed)
Patient Advocate Encounter   Received notification that prior authorization for Entresto 24mg /26mg  is required.   PA submitted on 05/06/2020 Key [id number] Status is pending       05/08/2020, CPhT Pharmacy Patient Advocate Specialist [facility] Antimicrobial Stewardship Team Direct Number: [phone]  Fax: [phone]
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
## 2020-05-06 NOTE — Progress Notes (Signed)
CARDIAC REHAB PHASE I   Went to begin education with pt. Pts RN states pt is not appropriate at this time. Will f/u tomorrow as able.  Reynold Bowen, RN BSN 05/06/2020 1:10 PM

## 2020-05-06 NOTE — TOC Benefit Eligibility Note (Signed)
Patient Advocate Encounter  Prior Authorization for Entresto 24mg /26mg  has been approved.    PA# Effective dates: 05/06/2020 through 05/07/2023  Patients co-pay is $604.51 due to a $3,000.00 deductible.    05/09/2023, CPhT Pharmacy Patient Advocate Specialist [place] Antimicrobial Stewardship Team Direct Number: [phone]  Fax: [phone]

## 2020-05-06 NOTE — Progress Notes (Signed)
Patient ID: Tyler Jacobson, male   DOB: April 11, 1975, 45 y.o.   MRN: [id number]     Advanced Heart Failure Rounding Note  PCP-Cardiologist: No primary care provider on file.   Subjective:    Patient is off inotropes with stable BP.  Good diuresis yesterday, CVP still around 13-14.  Unable to extubate due to heavy secretions.   Impella/Swan removed 4/25.   Febrile to 101. Trach aspirate growing H influenzae.  On ceftriaxone.   Follows commands on vent.   Echo: EF 30-35% with apical akinesis.  Repeat echo (4/25): EF >60%, normal RV   Objective:   Weight Range: 102.9 kg Body mass index is 35.53 kg/m.   Vital Signs:   Temp:  [99.2 F (37.3 C)-101 F (38.3 C)] 100.3 F (37.9 C) (04/28 0000) Pulse Rate:  [66-88] 70 (04/28 0416) Resp:  [0-28] 28 (04/28 0416) BP: (97-139)/(54-80) 139/78 (04/28 0733) SpO2:  [89 %-99 %] 98 % (04/28 0416) Arterial Line BP: (102-154)/(48-77) 120/67 (04/28 0100) FiO2 (%):  [40 %-70 %] 40 % (04/28 0734) Weight:  [102.9 kg] 102.9 kg (04/28 0157) Last BM Date:  (pta)  Weight change: Filed Weights   05/04/20 0500 05/05/20 0300 05/06/20 0157  Weight: 107.2 kg 104.1 kg 102.9 kg    Intake/Output:   Intake/Output Summary (Last 24 hours) at 05/06/2020 0756 Last data filed at 05/06/2020 0644 Gross per 24 hour  Intake 3263.39 ml  Output 4335 ml  Net -1071.61 ml      Physical Exam  CVP 12-13 General: NAD, intubated Neck: No JVD, no thyromegaly or thyroid nodule.  Lungs: Decreased at bases.  CV: Nondisplaced PMI.  Heart regular S1/S2, no S3/S4, no murmur.  No peripheral edema.   Abdomen: Soft, nontender, no hepatosplenomegaly, no distention.  Skin: Intact without lesions or rashes.  Neurologic: Follows commands.  Extremities: No clubbing or cyanosis.  HEENT: Normal.    Telemetry   NSR 70s personally reviewed.    Labs    CBC Recent Labs    05/05/20 0235 05/06/20 0328  WBC 9.5 9.3  HGB 8.7* 8.8*  HCT 26.0* 25.5*  MCV 94.2 91.4   PLT 185 409   Basic Metabolic Panel Recent Labs    05/05/20 0235 05/06/20 0328  NA 136 138  K 3.1* 3.1*  CL 105 104  CO2 21* 24  GLUCOSE 186* 111*  BUN 22* 26*  CREATININE 1.26* 1.27*  CALCIUM 7.7* 8.4*  MG 2.1 2.1  PHOS 2.4* 3.4   Liver Function Tests Recent Labs    05/05/20 0235 05/06/20 0328  AST 30 58*  ALT 43 60*  ALKPHOS 75 109  BILITOT 0.9 0.5  PROT 5.5* 6.3*  ALBUMIN 2.6* 2.7*   No results for input(s): LIPASE, AMYLASE in the last 72 hours. Cardiac Enzymes No results for input(s): CKTOTAL, CKMB, CKMBINDEX, TROPONINI in the last 72 hours.  BNP: BNP (last 3 results) No results for input(s): BNP in the last 8760 hours.  ProBNP (last 3 results) No results for input(s): PROBNP in the last 8760 hours.   D-Dimer No results for input(s): DDIMER in the last 72 hours. Hemoglobin A1C No results for input(s): HGBA1C in the last 72 hours. Fasting Lipid Panel Recent Labs    05/05/20 0235  TRIG 432*   Thyroid Function Tests No results for input(s): TSH, T4TOTAL, T3FREE, THYROIDAB in the last 72 hours.  Invalid input(s): FREET3  Other results:   Imaging    No results found.   Medications:  Scheduled Medications: . amiodarone  200 mg Per Tube BID  . aspirin  81 mg Per Tube Daily  . atorvastatin  80 mg Per Tube Daily  . carvedilol  3.125 mg Oral BID WC  . chlorhexidine gluconate (MEDLINE KIT)  15 mL Mouth Rinse BID  . Chlorhexidine Gluconate Cloth  6 each Topical Daily  . clonazePAM  0.5 mg Per Tube TID  . docusate  100 mg Per Tube BID  . feeding supplement (PROSource TF)  45 mL Per Tube QID  . fentaNYL (SUBLIMAZE) injection  50 mcg Intravenous Once  . insulin aspart  0-15 Units Subcutaneous Q4H  . losartan  12.5 mg Oral Daily  . mouth rinse  15 mL Mouth Rinse 10 times per day  . metoCLOPramide (REGLAN) injection  5 mg Intravenous Q8H  . pantoprazole sodium  40 mg Per Tube Daily  . polyethylene glycol  17 g Per Tube BID  . potassium  chloride  40 mEq Oral Q4H  . QUEtiapine  25 mg Per Tube BID  . sodium chloride flush  10-40 mL Intracatheter Q12H  . sodium chloride flush  10-40 mL Intracatheter Q12H  . sodium chloride flush  3 mL Intravenous Q12H  . ticagrelor  90 mg Per Tube BID    Infusions: . sodium chloride    . sodium chloride Stopped (05/04/20 1212)  . cefTRIAXone (ROCEPHIN)  IV Stopped (05/05/20 1236)  . dexmedetomidine (PRECEDEX) IV infusion 1.1 mcg/kg/hr (05/06/20 0610)  . feeding supplement (VITAL 1.5 CAL) 1,000 mL (05/06/20 0351)  . fentaNYL infusion INTRAVENOUS 200 mcg/hr (05/06/20 0200)  . norepinephrine (LEVOPHED) Adult infusion Stopped (05/05/20 2241)  . potassium chloride 10 mEq (05/06/20 0642)    PRN Medications: sodium chloride, sodium chloride, acetaminophen, artificial tears, bisacodyl, fentaNYL, midazolam, nitroGLYCERIN, oxyCODONE, sodium chloride flush, sodium chloride flush, sodium chloride flush   Assessment/Plan   1. CAD: Anterior STEMI.  Occlusion of distal/apical LAD on cath.  Small caliber vessel, no intervention.  - Continue ASA 81 and Brilinta 90 bid.  - Continue statin.  2. Cardiac arrest: VF due to STEMI.  Rhythm stable here.  - Now on amiodarone 200 mg po bid.   - ICD for secondary prevention prior to discharge.  3. Cardiogenic shock:  Echo with EF 30-35%, apical akinesis => repeat improved to >60%.  Impella out 4/25.  Off dobutamine, good diuresis yesterday but CVP still 12-13.   - Lasix 80 mg IV bid x 2 doses today.  Replace K.    - Can add Coreg 3.125 mg bid and losartan 12.5 daily. 4. AKI: Creatinine has trended down, stable today.   5. ID: Afebrile.  PNA with H influenzae on trach aspirate.  - Ceftriaxone.   6. Acute hypoxemic respiratory failure: Awake on vent this morning, hopefully can extubate.   CRITICAL CARE Performed by: Loralie Champagne  Total critical care time: 35 minutes  Critical care time was exclusive of separately billable procedures and treating other  patients.  Critical care was necessary to treat or prevent imminent or life-threatening deterioration.  Critical care was time spent personally by me on the following activities: development of treatment plan with patient and/or surrogate as well as nursing, discussions with consultants, evaluation of patient's response to treatment, examination of patient, obtaining history from patient or surrogate, ordering and performing treatments and interventions, ordering and review of laboratory studies, ordering and review of radiographic studies, pulse oximetry and re-evaluation of patient's condition.  Loralie Champagne 05/06/2020 7:56 AM
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
## 2020-05-06 NOTE — Progress Notes (Signed)
Patient vent alarm going off. Walked into patient's room to find patient self extubated despite having mitts and wrist restraints on. Placed on 2L O2 via nasal canula with O2 saturation of 96-98%. Patient confused and delirious. Alert and oriented to self only. Reoriented patient. RT at bedside. Tessie Fass, PA, made aware and is at bedside assessing patient. Icard, MD, also made aware. Patient in no apparent distress.

## 2020-05-06 NOTE — Procedures (Signed)
Extubation Procedure Note  Patient Details:   Name: Tyler Jacobson DOB: 21-Aug-1975 MRN: [id number]   Airway Documentation:    Vent end date: 05/06/20 Vent end time: 0825   Evaluation  O2 sats: stable throughout Complications: No apparent complications Patient did tolerate procedure well. Bilateral Breath Sounds: Diminished   Yes   Pt managed to self extubate with hand mitts and restraints. Other than being confused about where he is, he isn't in any distress/ stable at this time. No stridor noted. Clear/ dim. BS bilaterally. Pt was placed on 4 L [place]. RT will monitor.   Merlene Laughter 05/06/2020, 8:29 AM

## 2020-05-06 NOTE — TOC Initial Note (Signed)
Transition of Care (TOC) - Initial/Assessment Note  Heart Failure   Patient Details  Name: Tyler Jacobson MRN: [id number] Date of Birth: [birth year]  Transition of Care [facility]) CM/SW Contact:    Desha Bitner, LCSWA Phone Number: 05/06/2020, 10:14 AM  Clinical Narrative:             Per request of APP due to patient not having health insurance CSW instructed to reach out. Patient is intubated and CSW reached out to the patients ex-wife Morrie Sheldon [phone] and spoke over the phone. Morrie Sheldon reported that the patient does have health insurance including [facility] and she gave all that information to the hospital upon patients arrival. Morrie Sheldon stated that the patient sells health insurance for a living and does have coverage and that his bills are paid up until May but if he cannot work for [employer] this may affect some of his bills but until he is extubated and able to make decisions and advocate for himself she will be the point of contact. Morrie Sheldon reported that the patient doesn't have a primary care doctor and he lives alone and will need support with ADL's upon discharge. Morrie Sheldon stated that she and their 23 year old daughter are the patients only family support but that he does have social support as well. CSW will continue to follow the patient and reach out to the patients ex-wife as needed.  TOC will continue to follow for d/c needs.     Barriers to Discharge: Continued Medical Work up   Patient Goals and CMS Choice        Expected Discharge Plan and Services   In-house Referral: Clinical Social Work     Living arrangements for the past 2 months: Single Family Home                                      Prior Living Arrangements/Services Living arrangements for the past 2 months: Single Family Home Lives with:: Self Patient language and need for interpreter reviewed:: Yes        Need for Family Participation in Patient Care: Yes (Comment) (Patient is intubated and  spoke with patients ex-wife) Care giver support system in place?: No (comment)   Criminal Activity/Legal Involvement Pertinent to Current Situation/Hospitalization: No - Comment as needed  Activities of Daily Living Home Assistive Devices/Equipment: None ADL Screening (condition at time of admission) Patient's cognitive ability adequate to safely complete daily activities?: Yes Is the patient deaf or have difficulty hearing?: No Does the patient have difficulty seeing, even when wearing glasses/contacts?: No Does the patient have difficulty concentrating, remembering, or making decisions?: No Patient able to express need for assistance with ADLs?: Yes Does the patient have difficulty dressing or bathing?: No Independently performs ADLs?: Yes (appropriate for developmental age) Does the patient have difficulty walking or climbing stairs?: No Weakness of Legs: None Weakness of Arms/Hands: None  Permission Sought/Granted                  Emotional Assessment Appearance:: Appears stated age Attitude/Demeanor/Rapport: Unable to Assess (Patient intubated) Affect (typically observed): Unable to Assess (Patient intubated)   Alcohol / Substance Use: Not Applicable Psych Involvement: No (comment)  Admission diagnosis:  Cardiac arrest (HCC) [I46.9] ST elevation myocardial infarction (STEMI), unspecified artery (HCC) [I21.3] STEMI (ST elevation myocardial infarction) (HCC) [I21.3] Acute ST elevation myocardial infarction (STEMI) due to occlusion of distal portion of left anterior  descending (LAD) coronary artery (HCC) [I21.02] Patient Active Problem List   Diagnosis Date Noted  . Morbid obesity (HCC) 05/02/2020  . Tobacco use 05/02/2020  . Hypokalemia   . Cardiogenic shock (HCC)   . STEMI (ST elevation myocardial infarction) (HCC) 05/01/2020  . Acute ST elevation myocardial infarction (STEMI) due to occlusion of distal portion of left anterior descending (LAD) coronary artery (HCC)    . Cardiac arrest (HCC)    PCP:  Pcp, No Pharmacy:   [facility] [address] [place] [address] Phone: [phone] Fax: [phone]     Social Determinants of Health (SDOH) Interventions Food Insecurity Interventions: Intervention Not Indicated,Other (Comment) (This may change if patient is unable to return to work for [employer].) [employer] Interventions: Intervention Not Indicated (This may change if patient is unable to return to work for [employer]. Information obtained from patients ex-wife.) Housing Interventions: Intervention Not Indicated Transportation Interventions: Intervention Not Indicated  Readmission Risk Interventions No flowsheet data found.  Annebelle Bostic, MSW, LCSWA [phone] Heart Failure Social Worker

## 2020-05-07 DIAGNOSIS — I469 Cardiac arrest, cause unspecified: Secondary | ICD-10-CM

## 2020-05-07 DIAGNOSIS — I2102 ST elevation (STEMI) myocardial infarction involving left anterior descending coronary artery: Secondary | ICD-10-CM | POA: Diagnosis not present

## 2020-05-07 LAB — CBC
HCT: 29.8 % — ABNORMAL LOW (ref 39.0–52.0)
Hemoglobin: 9.9 g/dL — ABNORMAL LOW (ref 13.0–17.0)
MCH: 30.9 pg (ref 26.0–34.0)
MCHC: 33.2 g/dL (ref 30.0–36.0)
MCV: 93.1 fL (ref 80.0–100.0)
Platelets: 307 10*3/uL (ref 150–400)
RBC: 3.2 MIL/uL — ABNORMAL LOW (ref 4.22–5.81)
RDW: 13.7 % (ref 11.5–15.5)
WBC: 13 10*3/uL — ABNORMAL HIGH (ref 4.0–10.5)
nRBC: 0 % (ref 0.0–0.2)

## 2020-05-07 LAB — COMPREHENSIVE METABOLIC PANEL
ALT: 101 U/L — ABNORMAL HIGH (ref 0–44)
AST: 89 U/L — ABNORMAL HIGH (ref 15–41)
Albumin: 3 g/dL — ABNORMAL LOW (ref 3.5–5.0)
Alkaline Phosphatase: 114 U/L (ref 38–126)
Anion gap: 12 (ref 5–15)
BUN: 22 mg/dL — ABNORMAL HIGH (ref 6–20)
CO2: 25 mmol/L (ref 22–32)
Calcium: 8.6 mg/dL — ABNORMAL LOW (ref 8.9–10.3)
Chloride: 101 mmol/L (ref 98–111)
Creatinine, Ser: 1.11 mg/dL (ref 0.61–1.24)
GFR, Estimated: >60 mL/min (ref >60)
Glucose, Bld: 107 mg/dL — ABNORMAL HIGH (ref 70–99)
Potassium: 3.3 mmol/L — ABNORMAL LOW (ref 3.5–5.1)
Sodium: 138 mmol/L (ref 135–145)
Total Bilirubin: 1.1 mg/dL (ref 0.3–1.2)
Total Protein: 6.8 g/dL (ref 6.5–8.1)

## 2020-05-07 LAB — COOXEMETRY PANEL
Carboxyhemoglobin: 1.5 % (ref 0.5–1.5)
Methemoglobin: 0.8 % (ref 0.0–1.5)
O2 Saturation: 70.4 %
Total hemoglobin: 9.9 g/dL — ABNORMAL LOW (ref 12.0–16.0)

## 2020-05-07 LAB — CULTURE, BLOOD (ROUTINE X 2)
Culture: NO GROWTH
Culture: NO GROWTH

## 2020-05-07 LAB — GLUCOSE, CAPILLARY
Glucose-Capillary: 114 mg/dL — ABNORMAL HIGH (ref 70–99)
Glucose-Capillary: 110 mg/dL — ABNORMAL HIGH (ref 70–99)
Glucose-Capillary: 119 mg/dL — ABNORMAL HIGH (ref 70–99)
Glucose-Capillary: 145 mg/dL — ABNORMAL HIGH (ref 70–99)
Glucose-Capillary: 101 mg/dL — ABNORMAL HIGH (ref 70–99)

## 2020-05-07 LAB — MAGNESIUM: Magnesium: 2 mg/dL (ref 1.7–2.4)

## 2020-05-07 LAB — TRIGLYCERIDES: Triglycerides: 192 mg/dL — ABNORMAL HIGH (ref <150)

## 2020-05-07 LAB — PHOSPHORUS: Phosphorus: 4 mg/dL (ref 2.5–4.6)

## 2020-05-07 MED ORDER — DAPAGLIFLOZIN PROPANEDIOL 10 MG PO TABS
10.0000 mg | ORAL_TABLET | Freq: Every day | ORAL | Status: DC
  Administered 2020-05-07 – 2020-05-10 (×4): 10 mg via ORAL
  Filled 2020-05-07 (×4): qty 1

## 2020-05-07 MED ORDER — POTASSIUM CHLORIDE CRYS ER 20 MEQ PO TBCR
20.0000 meq | EXTENDED_RELEASE_TABLET | Freq: Once | ORAL | Status: AC
  Administered 2020-05-07: 20 meq via ORAL
  Filled 2020-05-07: qty 1

## 2020-05-07 MED ORDER — ORAL CARE MOUTH RINSE
15.0000 mL | Freq: Two times a day (BID) | OROMUCOSAL | Status: DC
  Administered 2020-05-07 – 2020-05-10 (×6): 15 mL via OROMUCOSAL

## 2020-05-07 MED ORDER — POTASSIUM CHLORIDE CRYS ER 20 MEQ PO TBCR
60.0000 meq | EXTENDED_RELEASE_TABLET | Freq: Once | ORAL | Status: AC
  Administered 2020-05-07: 60 meq via ORAL
  Filled 2020-05-07: qty 3

## 2020-05-07 MED ORDER — TRAMADOL HCL 50 MG PO TABS
50.0000 mg | ORAL_TABLET | Freq: Four times a day (QID) | ORAL | Status: DC | PRN
  Administered 2020-05-07 – 2020-05-09 (×7): 50 mg via ORAL
  Filled 2020-05-07 (×7): qty 1

## 2020-05-07 MED ORDER — CHLORHEXIDINE GLUCONATE 0.12 % MT SOLN
OROMUCOSAL | Status: AC
  Filled 2020-05-07: qty 15

## 2020-05-07 MED ORDER — ENOXAPARIN SODIUM 40 MG/0.4ML IJ SOSY
40.0000 mg | PREFILLED_SYRINGE | INTRAMUSCULAR | Status: DC
  Administered 2020-05-07 – 2020-05-09 (×3): 40 mg via SUBCUTANEOUS
  Filled 2020-05-07 (×3): qty 0.4

## 2020-05-07 MED ORDER — SPIRONOLACTONE 12.5 MG HALF TABLET
12.5000 mg | ORAL_TABLET | Freq: Every day | ORAL | Status: DC
  Administered 2020-05-07 – 2020-05-09 (×3): 12.5 mg via ORAL
  Filled 2020-05-07 (×3): qty 1

## 2020-05-07 NOTE — Progress Notes (Addendum)
Patient ID: Tyler Jacobson, male   DOB: 1975-03-08, 45 y.o.   MRN: [id number]     Advanced Heart Failure Rounding Note  PCP-Cardiologist: No primary care provider on file.   Subjective:    Impella/Swan removed 4/25.  4/28 Extubated.   Echo: EF 30-35% with apical akinesis.  Repeat echo (4/25): EF >60%, normal RV   Febrile to 100.4 . Trach aspirate growing H influenzae.  On ceftriaxone.    CO-OX 70%   Complaining of chest soreness.     Objective:   Weight Range: 103.8 kg Body mass index is 35.84 kg/m.   Vital Signs:   Temp:  [98.2 F (36.8 C)-100.4 F (38 C)] 98.6 F (37 C) (04/29 0408) Pulse Rate:  [77-103] 87 (04/29 0700) Resp:  [15-48] 26 (04/29 0700) BP: (108-159)/(67-95) 141/90 (04/29 0700) SpO2:  [89 %-100 %] 96 % (04/29 0700) Arterial Line BP: (141)/(74) 141/74 (04/28 0800) Weight:  [103.8 kg] 103.8 kg (04/29 0500) Last BM Date: 05/06/20  Weight change: Filed Weights   05/05/20 0300 05/06/20 0157 05/07/20 0500  Weight: 104.1 kg 102.9 kg 103.8 kg    Intake/Output:   Intake/Output Summary (Last 24 hours) at 05/07/2020 0738 Last data filed at 05/06/2020 1500 Gross per 24 hour  Intake 2105.43 ml  Output 2500 ml  Net -394.57 ml      Physical Exam  CVP 7-8  General:  . No resp difficulty HEENT: normal Neck: supple. no JVD. Carotids 2+ bilat; no bruits. No lymphadenopathy or thryomegaly appreciated. Cor: PMI nondisplaced. Regular rate & rhythm. No rubs, gallops or murmurs. Lungs: clear Abdomen: soft, nontender, distended. No hepatosplenomegaly. No bruits or masses. Good bowel sounds. Extremities: no cyanosis, clubbing, rash, edema. RUE PICC  Neuro: alert & orientedx3, cranial nerves grossly intact. moves all 4 extremities w/o difficulty. Affect pleasant   Telemetry   NSR 70-80s    Labs    CBC Recent Labs    05/06/20 0328 05/07/20 0417  WBC 9.3 13.0*  HGB 8.8* 9.9*  HCT 25.5* 29.8*  MCV 91.4 93.1  PLT 224 884   Basic Metabolic  Panel Recent Labs    05/06/20 0328 05/06/20 1506 05/07/20 0417  NA 138 139 138  K 3.1* 3.6 3.3*  CL 104 102 101  CO2 '24 24 25  ' GLUCOSE 111* 97 107*  BUN 26* 23* 22*  CREATININE 1.27* 1.13 1.11  CALCIUM 8.4* 8.4* 8.6*  MG 2.1  --  2.0  PHOS 3.4  --  4.0   Liver Function Tests Recent Labs    05/06/20 0328 05/07/20 0417  AST 58* 89*  ALT 60* 101*  ALKPHOS 109 114  BILITOT 0.5 1.1  PROT 6.3* 6.8  ALBUMIN 2.7* 3.0*   No results for input(s): LIPASE, AMYLASE in the last 72 hours. Cardiac Enzymes No results for input(s): CKTOTAL, CKMB, CKMBINDEX, TROPONINI in the last 72 hours.  BNP: BNP (last 3 results) No results for input(s): BNP in the last 8760 hours.  ProBNP (last 3 results) No results for input(s): PROBNP in the last 8760 hours.   D-Dimer No results for input(s): DDIMER in the last 72 hours. Hemoglobin A1C No results for input(s): HGBA1C in the last 72 hours. Fasting Lipid Panel Recent Labs    05/07/20 0417  TRIG 192*   Thyroid Function Tests No results for input(s): TSH, T4TOTAL, T3FREE, THYROIDAB in the last 72 hours.  Invalid input(s): FREET3  Other results:   Imaging    No results found.   Medications:  Scheduled Medications: . amiodarone  200 mg Oral BID  . aspirin  81 mg Oral Daily  . atorvastatin  80 mg Oral Daily  . carvedilol  3.125 mg Oral BID WC  . chlorhexidine gluconate (MEDLINE KIT)  15 mL Mouth Rinse BID  . Chlorhexidine Gluconate Cloth  6 each Topical Daily  . clonazePAM  0.5 mg Oral TID  . docusate sodium  100 mg Oral BID  . feeding supplement  1 Container Oral TID BM  . insulin aspart  0-15 Units Subcutaneous Q4H  . losartan  12.5 mg Oral Daily  . multivitamin with minerals  1 tablet Oral Daily  . polyethylene glycol  17 g Oral BID  . QUEtiapine  25 mg Oral BID  . sodium chloride flush  10-40 mL Intracatheter Q12H  . sodium chloride flush  10-40 mL Intracatheter Q12H  . sodium chloride flush  3 mL Intravenous  Q12H  . ticagrelor  90 mg Oral BID    Infusions: . sodium chloride    . sodium chloride Stopped (05/04/20 1212)  . cefTRIAXone (ROCEPHIN)  IV Stopped (05/06/20 1447)    PRN Medications: sodium chloride, sodium chloride, acetaminophen, artificial tears, bisacodyl, nitroGLYCERIN, oxyCODONE, sodium chloride flush, sodium chloride flush, sodium chloride flush   Assessment/Plan   1. CAD: Anterior STEMI.  Occlusion of distal/apical LAD on cath.  Small caliber vessel, no intervention.  - Continue ASA 81 and Brilinta 90 bid.  - Continue statin.  2. Cardiac arrest: VF due to STEMI.  No rhythm issues. Rhythm stable here.  - Now on amiodarone 200 mg po bid.   - ICD for secondary prevention . Consult EP.  3. Cardiogenic shock:  Echo with EF 30-35%, apical akinesis => repeat improved to >60%.  Impella out 4/25.   - CVP 7-8 . Stop IV lasix.  -Start farxiga 10 mg (Co-Pay 10.00) - Continue Coreg 3.125 mg bid, losartan 12.5 daily, add 12.5 mg spiro daily.  4. AKI: Renal function stable.    5. ID: Afebrile.  PNA with H influenzae on trach aspirate.  - Ceftriaxone.   6. Acute hypoxemic respiratory failure:  Extubated 4/28. Stable on room air.   Consult PT/Speech    Amy Clegg NP-C  05/07/2020 7:38 AM  Patient seen with NP, agree with the above note.   Extubated, still with fever overnight to 100.4.  BP stable.  CVP 7-8.  Main complaint is pleuritic chest pain post-CPR.    General: NAD Neck: No JVD, no thyromegaly or thyroid nodule.  Lungs: Decreased at bases.  CV: Chest wall sore.  Heart regular S1/S2, no S3/S4, no murmur.  No peripheral edema.   Abdomen: Soft, nontender, no hepatosplenomegaly, no distention.  Skin: Intact without lesions or rashes.  Neurologic: Alert and oriented x 3.  Psych: Normal affect. Extremities: No clubbing or cyanosis.  HEENT: Normal.   Agree with stopping Lasix, start Farxiga.  Continue other cardiac meds.   ICD needed for secondary prevention, will ask  EP to see.   On ceftriaxone for H influenzae PNA.  Will need pain control for post-CPR chest pain .  Loralie Champagne 05/07/2020 8:12 AM
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
## 2020-05-07 NOTE — Progress Notes (Signed)
CARDIAC REHAB PHASE I   Pt with recent ambulation with PT. Tired now. Pt given heart attack book along with heart healthy diet. Reviewed site care, restrictions, and exercise guidelines. Referred to CRP II [place]. Pt needing ICD prior to discharge, unknown timing. Will continue to follow.  [id number] Reynold Bowen, RN BSN 05/07/2020 2:49 PM

## 2020-05-07 NOTE — Evaluation (Signed)
Clinical/Bedside Swallow Evaluation Patient Details  Name: Tyler Jacobson MRN: [id number] Date of Birth: 1975/01/29  Today's Date: 05/07/2020 Time: SLP Start Time (ACUTE ONLY): 4967 SLP Stop Time (ACUTE ONLY): 1024 SLP Time Calculation (min) (ACUTE ONLY): 26 min  Past Medical History: History reviewed. No pertinent past medical history. Past Surgical History: The histories are not reviewed yet. Please review them in the "History" navigator section and refresh this SmartLink. HPI:  Pt is a 45 yo male admitted s/p VF arrest secondary to STEMI (ROSC achieved after ~10 minutes) with cardiogenic shock requiring pressors, acute respiratory failure requiring intubation 4/23 (self-extubation 4/28), and AKI. PMH includes tobacco abuse and pt-reported reflux.   Assessment / Plan / Recommendation Clinical Impression  Pt is moderately hoarse s/p self-extubation on previous date, with mentation appropriate throughout tasks but also confused and with reduced recall of recent events. His cough can be strong and productive, but it is also guarded due to pain in his chest from resuscitation efforts. He has no overt s/s of aspiration across PO trials, but when he tries more solid foods he has c/o mild odynophagia and feeling like the food won't go down. Recommend starting with Dys 2 (chopped) diet and thin liquids with use of general aspiration and esophageal precautions. SLP also provided education on the importance of trying to cough when needed, especially with PO intake. Will continue to follow with good prognosis to advance although potentially with additional time needed due to self-extubation. SLP Visit Diagnosis: Dysphagia, unspecified (R13.10)    Aspiration Risk  Mild aspiration risk    Diet Recommendation Dysphagia 2 (Fine chop);Thin liquid   Liquid Administration via: Cup;Straw Medication Administration: Whole meds with puree Supervision: Patient able to self feed;Intermittent supervision to cue  for compensatory strategies Compensations: Slow rate;Small sips/bites Postural Changes: Seated upright at 90 degrees;Remain upright for at least 30 minutes after po intake    Other  Recommendations Oral Care Recommendations: Oral care BID Other Recommendations: Have oral suction available   Follow up Recommendations  (tba)      Frequency and Duration min 2x/week  2 weeks       Prognosis Prognosis for Safe Diet Advancement: Good      Swallow Study   General HPI: Pt is a 45 yo male admitted s/p VF arrest secondary to STEMI (ROSC achieved after ~10 minutes) with cardiogenic shock requiring pressors, acute respiratory failure requiring intubation 4/23 (self-extubation 4/28), and AKI. PMH includes tobacco abuse and pt-reported reflux. Type of Study: Bedside Swallow Evaluation Previous Swallow Assessment: none in chart Diet Prior to this Study: Thin liquids Temperature Spikes Noted: Yes (100.4) Respiratory Status: Room air History of Recent Intubation: Yes Length of Intubations (days): 5 days Date extubated: 05/06/20 (self-extubation) Behavior/Cognition: Alert;Cooperative;Pleasant mood;Confused Oral Cavity Assessment: Within Functional Limits Oral Care Completed by SLP: No Oral Cavity - Dentition: Adequate natural dentition Vision: Functional for self-feeding Self-Feeding Abilities: Able to feed self Patient Positioning: Upright in bed Baseline Vocal Quality: Hoarse Volitional Cough: Weak;Other (Comment) (guarded) Volitional Swallow: Able to elicit    Oral/Motor/Sensory Function Overall Oral Motor/Sensory Function: Within functional limits   Ice Chips Ice chips: Not tested   Thin Liquid Thin Liquid: Within functional limits Presentation: Self Fed;Straw    Nectar Thick Nectar Thick Liquid: Not tested   Honey Thick Honey Thick Liquid: Not tested   Puree Puree: Within functional limits Presentation: Self Fed;Spoon   Solid     Solid: Impaired Presentation: Self  Fed Pharyngeal Phase Impairments: Other (comments) (pt c/o not  going down, facial grimacing)       Mahala Menghini., M.A. CCC-SLP Acute Rehabilitation Services Pager [phone] Office [phone]  05/07/2020,10:38 AM
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
## 2020-05-08 DIAGNOSIS — Z720 Tobacco use: Secondary | ICD-10-CM

## 2020-05-08 LAB — BASIC METABOLIC PANEL
Anion gap: 11 (ref 5–15)
BUN: 22 mg/dL — ABNORMAL HIGH (ref 6–20)
CO2: 25 mmol/L (ref 22–32)
Calcium: 9.1 mg/dL (ref 8.9–10.3)
Chloride: 102 mmol/L (ref 98–111)
Creatinine, Ser: 1.12 mg/dL (ref 0.61–1.24)
GFR, Estimated: >60 mL/min (ref >60)
Glucose, Bld: 103 mg/dL — ABNORMAL HIGH (ref 70–99)
Potassium: 4.4 mmol/L (ref 3.5–5.1)
Sodium: 138 mmol/L (ref 135–145)

## 2020-05-08 LAB — COOXEMETRY PANEL
Carboxyhemoglobin: 1.3 % (ref 0.5–1.5)
Methemoglobin: 0.8 % (ref 0.0–1.5)
O2 Saturation: 55.4 %
Total hemoglobin: 12.3 g/dL (ref 12.0–16.0)

## 2020-05-08 LAB — CBC
HCT: 27.9 % — ABNORMAL LOW (ref 39.0–52.0)
Hemoglobin: 9.1 g/dL — ABNORMAL LOW (ref 13.0–17.0)
MCH: 30.7 pg (ref 26.0–34.0)
MCHC: 32.6 g/dL (ref 30.0–36.0)
MCV: 94.3 fL (ref 80.0–100.0)
Platelets: 433 10*3/uL — ABNORMAL HIGH (ref 150–400)
RBC: 2.96 MIL/uL — ABNORMAL LOW (ref 4.22–5.81)
RDW: 13.9 % (ref 11.5–15.5)
WBC: 13.5 10*3/uL — ABNORMAL HIGH (ref 4.0–10.5)
nRBC: 0 % (ref 0.0–0.2)

## 2020-05-08 LAB — GLUCOSE, CAPILLARY
Glucose-Capillary: 111 mg/dL — ABNORMAL HIGH (ref 70–99)
Glucose-Capillary: 113 mg/dL — ABNORMAL HIGH (ref 70–99)
Glucose-Capillary: 122 mg/dL — ABNORMAL HIGH (ref 70–99)
Glucose-Capillary: 135 mg/dL — ABNORMAL HIGH (ref 70–99)
Glucose-Capillary: 138 mg/dL — ABNORMAL HIGH (ref 70–99)
Glucose-Capillary: 149 mg/dL — ABNORMAL HIGH (ref 70–99)
Glucose-Capillary: 97 mg/dL (ref 70–99)

## 2020-05-08 NOTE — Plan of Care (Signed)
  Problem: Safety: Goal: Non-violent Restraint(s) Outcome: Progressing   Problem: Education: Goal: Understanding of cardiac disease, CV risk reduction, and recovery process will improve Outcome: Progressing Goal: Understanding of medication regimen will improve Outcome: Progressing Goal: Individualized Educational Video(s) Outcome: Progressing   Problem: Activity: Goal: Ability to tolerate increased activity will improve Outcome: Progressing   Problem: Cardiac: Goal: Ability to achieve and maintain adequate cardiopulmonary perfusion will improve Outcome: Progressing Goal: Vascular access site(s) Level 0-1 will be maintained Outcome: Progressing   Problem: Health Behavior/Discharge Planning: Goal: Ability to safely manage health-related needs after discharge will improve Outcome: Progressing   Problem: Education: Goal: Knowledge of General Education information will improve Description: Including pain rating scale, medication(s)/side effects and non-pharmacologic comfort measures Outcome: Progressing   Problem: Health Behavior/Discharge Planning: Goal: Ability to manage health-related needs will improve Outcome: Progressing   Problem: Clinical Measurements: Goal: Ability to maintain clinical measurements within normal limits will improve Outcome: Progressing Goal: Will remain free from infection Outcome: Progressing Goal: Diagnostic test results will improve Outcome: Progressing Goal: Respiratory complications will improve Outcome: Progressing Goal: Cardiovascular complication will be avoided Outcome: Progressing   Problem: Activity: Goal: Risk for activity intolerance will decrease Outcome: Progressing   Problem: Nutrition: Goal: Adequate nutrition will be maintained Outcome: Progressing   Problem: Coping: Goal: Level of anxiety will decrease Outcome: Progressing   Problem: Elimination: Goal: Will not experience complications related to bowel  motility Outcome: Progressing Goal: Will not experience complications related to urinary retention Outcome: Progressing   Problem: Pain Managment: Goal: General experience of comfort will improve Outcome: Progressing   Problem: Safety: Goal: Ability to remain free from injury will improve Outcome: Progressing   Problem: Skin Integrity: Goal: Risk for impaired skin integrity will decrease Outcome: Progressing

## 2020-05-08 NOTE — Progress Notes (Signed)
CARDIAC REHAB PHASE I   PRE:  Rate/Rhythm: 80 SR  BP:  Supine:   Sitting: 119/88  Standing:    SaO2: 96% RA  MODE:  Ambulation: 400 ft   POST:  Rate/Rhythm: 91 SR  BP:  Supine: To BR, unable to check  Sitting:   Standing:    SaO2:  Tolerated ambulation well with rolling walker and assistance x1,  Slow with movement d/t chest soreness from previous CPR and seems delayed with verbal instructions and iniatiation of request.  Continue to follw while in hospital. 1100-1130 Cindra Eves RN, BSN 05/08/2020 11:25 AM

## 2020-05-08 NOTE — Progress Notes (Signed)
Progress Note  Patient Name: Tyler Jacobson Date of Encounter: 05/08/2020  [facility] Cardiologist: No primary care provider on file. New ([place], [place])  Subjective   Sore chest when he moves or coughs. No angina -like symptoms with walking. No dyspnea. No arrhythmia on telemetry.  Inpatient Medications    Scheduled Meds: . amiodarone  200 mg Oral BID  . aspirin  81 mg Oral Daily  . atorvastatin  80 mg Oral Daily  . carvedilol  3.125 mg Oral BID WC  . chlorhexidine gluconate (MEDLINE KIT)  15 mL Mouth Rinse BID  . Chlorhexidine Gluconate Cloth  6 each Topical Daily  . clonazePAM  0.5 mg Oral TID  . dapagliflozin propanediol  10 mg Oral Daily  . docusate sodium  100 mg Oral BID  . enoxaparin (LOVENOX) injection  40 mg Subcutaneous Q24H  . feeding supplement  1 Container Oral TID BM  . insulin aspart  0-15 Units Subcutaneous Q4H  . losartan  12.5 mg Oral Daily  . mouth rinse  15 mL Mouth Rinse BID  . multivitamin with minerals  1 tablet Oral Daily  . polyethylene glycol  17 g Oral BID  . QUEtiapine  25 mg Oral BID  . sodium chloride flush  10-40 mL Intracatheter Q12H  . sodium chloride flush  10-40 mL Intracatheter Q12H  . sodium chloride flush  3 mL Intravenous Q12H  . spironolactone  12.5 mg Oral Daily   Continuous Infusions: . sodium chloride    . sodium chloride Stopped (05/04/20 1212)  . cefTRIAXone (ROCEPHIN)  IV Stopped (05/07/20 1108)   PRN Meds: sodium chloride, sodium chloride, acetaminophen, artificial tears, bisacodyl, nitroGLYCERIN, oxyCODONE, sodium chloride flush, sodium chloride flush, sodium chloride flush, traMADol   Vital Signs    Vitals:   05/08/20 0600 05/08/20 0700 05/08/20 0800 05/08/20 0900  BP: 136/89 129/88 (!) 153/86 129/90  Pulse: 79 78 91 83  Resp: (!) 33 (!) 33 (!) 27 (!) 32  Temp:  98.2 F (36.8 C)    TempSrc:      SpO2: 94% 94% 95% 93%  Weight:      Height:        Intake/Output Summary (Last 24 hours) at  05/08/2020 1021 Last data filed at 05/08/2020 0800 Gross per 24 hour  Intake 554.93 ml  Output --  Net 554.93 ml   Last 3 Weights 05/07/2020 05/06/2020 05/05/2020  Weight (lbs) 228 lb 13.4 oz 226 lb 13.7 oz 229 lb 8 oz  Weight (kg) 103.8 kg 102.9 kg 104.1 kg      Telemetry    NSR - Personally Reviewed  ECG    NSR, Q waves V4-V6 and inferior leads - Personally Reviewed  Physical Exam  Obese. GEN: No acute distress.   Neck: No JVD Cardiac: RRR, no murmurs, rubs, or gallops.  Respiratory: Clear to auscultation bilaterally. Tender anterior chest GI: Soft, nontender, non-distended  MS: No edema; No deformity. Neuro:  Nonfocal  Psych: Normal affect   Labs    High Sensitivity Troponin:   Recent Labs  Lab 05/01/20 2104 05/02/20 0414  TROPONINIHS 5,667* 20,612*      Chemistry Recent Labs  Lab 05/05/20 0235 05/06/20 0328 05/06/20 1506 05/07/20 0417 05/08/20 0414  NA 136 138 139 138 138  K 3.1* 3.1* 3.6 3.3* 4.4  CL 105 104 102 101 102  CO2 21* '24 24 25 25  ' GLUCOSE 186* 111* 97 107* 103*  BUN 22* 26* 23* 22* 22*  CREATININE 1.26* 1.27*  1.13 1.11 1.12  CALCIUM 7.7* 8.4* 8.4* 8.6* 9.1  PROT 5.5* 6.3*  --  6.8  --   ALBUMIN 2.6* 2.7*  --  3.0*  --   AST 30 58*  --  89*  --   ALT 43 60*  --  101*  --   ALKPHOS 75 109  --  114  --   BILITOT 0.9 0.5  --  1.1  --   GFRNONAA >60 >60 >60 >60 >60  ANIONGAP '10 10 13 12 11     ' Hematology Recent Labs  Lab 05/06/20 0328 05/07/20 0417 05/08/20 0414  WBC 9.3 13.0* 13.5*  RBC 2.79* 3.20* 2.96*  HGB 8.8* 9.9* 9.1*  HCT 25.5* 29.8* 27.9*  MCV 91.4 93.1 94.3  MCH 31.5 30.9 30.7  MCHC 34.5 33.2 32.6  RDW 13.5 13.7 13.9  PLT 224 307 433*    BNPNo results for input(s): BNP, PROBNP in the last 168 hours.   DDimer No results for input(s): DDIMER in the last 168 hours.   Radiology    No results found.  Cardiac Studies   CATH 05/01/2020   Prox RCA to Mid RCA lesion is 30% stenosed.  Dist LAD lesion is 100%  stenosed.   1. Acute anterior STEMI secondary to thrombotic occlusion of the apical/distal LAD 2. Cardiac arrest 3. Mild non-obstructive disease in the mid RCA 4. Cardiogenic shock-Impella placement for hemodynamic support.   Recommendations: He will be admitted to the ICU in critical condition. Will continue Impella for hemodynamic support. Will load with Brilinta 180 mg po x 1 when the OG tube is in place. Will load with ASA 325 mg po x 1. Aggrastat for six hours. Heparin per pharmacy for Impella. High intensity statin. Low dose Levophed to be titrated. Continue amiodarone drip for now. Impella rep is present to assist with device optimization. PCCM to assist with vent management. Echo team paged to evaluate the depth of the Impella device.     ECHO 05/02/2020  1. Impella noted in LV; 4-4.5 cm from aortic annulus. Apical akinesis  with overall moderate to severe LV dysfunction.  2. Left ventricular ejection fraction, by estimation, is 30 to 35%. The  left ventricle has moderately decreased function. The left ventricle  demonstrates regional wall motion abnormalities (see scoring  diagram/findings for description). Left ventricular  diastolic parameters are consistent with Grade I diastolic dysfunction  (impaired relaxation).  3. Right ventricular systolic function is normal. The right ventricular  size is normal.  4. The mitral valve is normal in structure. No evidence of mitral valve  regurgitation. No evidence of mitral stenosis.  5. The aortic valve was not well visualized. Aortic valve regurgitation  Not assessed.  6. The inferior vena cava is normal in size with greater than 50%  respiratory variability, suggesting right atrial pressure of 3 mmHg.     ECHO 05/03/2020  1. Impella cateter noted across the aortic valve and into the left  ventricle. 6.5 cm from the aortic annulus. Impella tip terminates distal  in the left ventricular carvity, 2.6 cm from the apex.  Compared with the  echo yesterday (08/19/89) systolic  function has improved significantly. Left ventricular ejection fraction,  by estimation, is 65 to 70%. The left ventricle has normal function. The  left ventricle has no regional wall motion abnormalities.  2. Right ventricular systolic function is normal. The right ventricular  size is normal.  3. The mitral valve is grossly normal.  4. The aortic valve  was not well visualized.     Patient Profile     45 y.o. male smoker, obese, without prior CV history, presents with resuscitated VF arrest and found to have distal LD occlusion managed medically, recovered LVEF. Transient respiratory failure with febrile illness, possibly H. Influenzae pneumonia.   Assessment & Plan    1. VF arrest: on amio. Plan ICD on Monday 2. CAD: no preceding angina. Occluded distal LAD. Normal LVEF. On DAPT, statin, carvedilol, ARB, spironolactone, Wilder Glade. 3. Resp failure/pneumonia:  Resolved. 4. Prediabetes: A1c 6.1% on admission. On Farxiga for HFPEF, should improve.  For questions or updates, please contact [place] Please consult [email or web address] for contact info under        Signed, Sanda Klein, MD  05/08/2020, 10:21 AM

## 2020-05-09 LAB — CBC
HCT: 29.7 % — ABNORMAL LOW (ref 39.0–52.0)
Hemoglobin: 9.8 g/dL — ABNORMAL LOW (ref 13.0–17.0)
MCH: 31.3 pg (ref 26.0–34.0)
MCHC: 33 g/dL (ref 30.0–36.0)
MCV: 94.9 fL (ref 80.0–100.0)
Platelets: 468 10*3/uL — ABNORMAL HIGH (ref 150–400)
RBC: 3.13 MIL/uL — ABNORMAL LOW (ref 4.22–5.81)
RDW: 13.6 % (ref 11.5–15.5)
WBC: 11.6 10*3/uL — ABNORMAL HIGH (ref 4.0–10.5)
nRBC: 0.2 % (ref 0.0–0.2)

## 2020-05-09 LAB — COOXEMETRY PANEL
Carboxyhemoglobin: 1.4 % (ref 0.5–1.5)
Methemoglobin: 0.9 % (ref 0.0–1.5)
O2 Saturation: 57.9 %
Total hemoglobin: 9.9 g/dL — ABNORMAL LOW (ref 12.0–16.0)

## 2020-05-09 LAB — BASIC METABOLIC PANEL
Anion gap: 10 (ref 5–15)
BUN: 20 mg/dL (ref 6–20)
CO2: 27 mmol/L (ref 22–32)
Calcium: 8.9 mg/dL (ref 8.9–10.3)
Chloride: 99 mmol/L (ref 98–111)
Creatinine, Ser: 1.02 mg/dL (ref 0.61–1.24)
GFR, Estimated: >60 mL/min (ref >60)
Glucose, Bld: 91 mg/dL (ref 70–99)
Potassium: 4.2 mmol/L (ref 3.5–5.1)
Sodium: 136 mmol/L (ref 135–145)

## 2020-05-09 LAB — SURGICAL PCR SCREEN
MRSA, PCR: POSITIVE — AB
Staphylococcus aureus: POSITIVE — AB

## 2020-05-09 LAB — GLUCOSE, CAPILLARY
Glucose-Capillary: 103 mg/dL — ABNORMAL HIGH (ref 70–99)
Glucose-Capillary: 104 mg/dL — ABNORMAL HIGH (ref 70–99)
Glucose-Capillary: 115 mg/dL — ABNORMAL HIGH (ref 70–99)
Glucose-Capillary: 76 mg/dL (ref 70–99)
Glucose-Capillary: 80 mg/dL (ref 70–99)
Glucose-Capillary: 94 mg/dL (ref 70–99)

## 2020-05-09 MED ORDER — MUPIROCIN 2 % EX OINT
1.0000 "application " | TOPICAL_OINTMENT | Freq: Two times a day (BID) | CUTANEOUS | Status: DC
  Administered 2020-05-09 – 2020-05-10 (×2): 1 via NASAL
  Filled 2020-05-09: qty 22

## 2020-05-09 MED ORDER — SODIUM CHLORIDE 0.9 % IV SOLN: INTRAVENOUS | Status: DC

## 2020-05-09 MED ORDER — CEFAZOLIN SODIUM-DEXTROSE 2-4 GM/100ML-% IV SOLN
2.0000 g | INTRAVENOUS | Status: DC
  Filled 2020-05-09: qty 100

## 2020-05-09 MED ORDER — CHLORHEXIDINE GLUCONATE CLOTH 2 % EX PADS
6.0000 | MEDICATED_PAD | Freq: Every day | CUTANEOUS | Status: DC
  Administered 2020-05-10: 6 via TOPICAL

## 2020-05-09 MED ORDER — SODIUM CHLORIDE 0.9 % IV SOLN
80.0000 mg | INTRAVENOUS | Status: AC
  Administered 2020-05-10: 80 mg
  Filled 2020-05-09: qty 2

## 2020-05-09 NOTE — Progress Notes (Signed)
Progress Note  Patient Name: Tyler Jacobson Date of Encounter: 05/09/2020  [facility] Cardiologist: No primary care provider on file. New ([place], [place])  Subjective   Chest still hurts a lot when he coughs or moves.  Chest sometimes pops when he moves.  No arrhythmia on telemetry  Inpatient Medications    Scheduled Meds: . amiodarone  200 mg Oral BID  . aspirin  81 mg Oral Daily  . atorvastatin  80 mg Oral Daily  . carvedilol  3.125 mg Oral BID WC  . chlorhexidine gluconate (MEDLINE KIT)  15 mL Mouth Rinse BID  . Chlorhexidine Gluconate Cloth  6 each Topical Daily  . clonazePAM  0.5 mg Oral TID  . dapagliflozin propanediol  10 mg Oral Daily  . docusate sodium  100 mg Oral BID  . enoxaparin (LOVENOX) injection  40 mg Subcutaneous Q24H  . feeding supplement  1 Container Oral TID BM  . insulin aspart  0-15 Units Subcutaneous Q4H  . losartan  12.5 mg Oral Daily  . mouth rinse  15 mL Mouth Rinse BID  . multivitamin with minerals  1 tablet Oral Daily  . polyethylene glycol  17 g Oral BID  . QUEtiapine  25 mg Oral BID  . sodium chloride flush  10-40 mL Intracatheter Q12H  . sodium chloride flush  10-40 mL Intracatheter Q12H  . sodium chloride flush  3 mL Intravenous Q12H  . spironolactone  12.5 mg Oral Daily   Continuous Infusions: . sodium chloride    . sodium chloride Stopped (05/04/20 1212)  . cefTRIAXone (ROCEPHIN)  IV 2 g (05/08/20 1123)   PRN Meds: sodium chloride, sodium chloride, acetaminophen, artificial tears, bisacodyl, nitroGLYCERIN, oxyCODONE, sodium chloride flush, sodium chloride flush, sodium chloride flush, traMADol   Vital Signs    Vitals:   05/09/20 0619 05/09/20 0700 05/09/20 0727 05/09/20 0813  BP: 129/84 (!) 111/94 113/78   Pulse:  81 78 83  Resp:  (!) 22 (!) 21 17  Temp:   98.1 F (36.7 C)   TempSrc:   Oral   SpO2:  92% 95% 96%  Weight:      Height:        Intake/Output Summary (Last 24 hours) at 05/09/2020 1037 Last data filed  at 05/09/2020 0800 Gross per 24 hour  Intake 1310 ml  Output 1850 ml  Net -540 ml   Last 3 Weights 05/07/2020 05/06/2020 05/05/2020  Weight (lbs) 228 lb 13.4 oz 226 lb 13.7 oz 229 lb 8 oz  Weight (kg) 103.8 kg 102.9 kg 104.1 kg      Telemetry    Sinus rhythm- Personally Reviewed  ECG    No new tracing- Personally Reviewed  Physical Exam  Obese.  Bilateral scleral hemorrhages GEN: No acute distress.   Neck: No JVD Cardiac: RRR, no murmurs, rubs, or gallops.  Chest very tender to touch. Respiratory: Clear to auscultation bilaterally. GI: Soft, nontender, non-distended  MS: No edema; No deformity. Neuro:  Nonfocal  Psych: Normal affect   Labs    High Sensitivity Troponin:   Recent Labs  Lab 05/01/20 2104 05/02/20 0414  TROPONINIHS 5,667* 20,612*      Chemistry Recent Labs  Lab 05/05/20 0235 05/06/20 0328 05/06/20 1506 05/07/20 0417 05/08/20 0414 05/09/20 0352  NA 136 138   < > 138 138 136  K 3.1* 3.1*   < > 3.3* 4.4 4.2  CL 105 104   < > 101 102 99  CO2 21* 24   < >  _0 GLUCOSE 186* 111*   < > 107* 103* 91  BUN 22* 26*   < > 22* 22* 20  CREATININE 1.26* 1.27*   < > 1.11 1.12 1.02  CALCIUM 7.7* 8.4*   < > 8.6* 9.1 8.9  PROT 5.5* 6.3*  --  6.8  --   --   ALBUMIN 2.6* 2.7*  --  3.0*  --   --   AST 30 58*  --  89*  --   --   ALT 43 60*  --  101*  --   --   ALKPHOS 75 109  --  114  --   --   BILITOT 0.9 0.5  --  1.1  --   --   GFRNONAA >60 >60   < > >60 >60 >60  ANIONGAP 10 10   < > _1 < > = values in this interval not displayed.     Hematology Recent Labs  Lab 05/07/20 0417 05/08/20 0414 05/09/20 0352  WBC 13.0* 13.5* 11.6*  RBC 3.20* 2.96* 3.13*  HGB 9.9* 9.1* 9.8*  HCT 29.8* 27.9* 29.7*  MCV 93.1 94.3 94.9  MCH 30.9 30.7 31.3  MCHC 33.2 32.6 33.0  RDW 13.7 13.9 13.6  PLT 307 433* 468*    BNPNo results for input(s): BNP, PROBNP in the last 168 hours.   DDimer No results for input(s): DDIMER in the last 168 hours.   Radiology     No results found.  Cardiac Studies   CATH 05/01/2020   Prox RCA to Mid RCA lesion is 30% stenosed.  Dist LAD lesion is 100% stenosed.  1. Acute anterior STEMI secondary to thrombotic occlusion of the apical/distal LAD 2. Cardiac arrest 3. Mild non-obstructive disease in the mid RCA 4. Cardiogenic shock-Impella placement for hemodynamic support.   Recommendations: He will be admitted to the ICU in critical condition. Will continue Impella for hemodynamic support. Will load with Brilinta 180 mg po x 1 when the OG tube is in place. Will load with ASA 325 mg po x 1. Aggrastat for six hours. Heparin per pharmacy for Impella. High intensity statin. Low dose Levophed to be titrated. Continue amiodarone drip for now. Impella rep is present to assist with device optimization. PCCM to assist with vent management. Echo team paged to evaluate the depth of the Impella device.     ECHO 05/02/2020  1. Impella noted in LV; 4-4.5 cm from aortic annulus. Apical akinesis  with overall moderate to severe LV dysfunction.  2. Left ventricular ejection fraction, by estimation, is 30 to 35%. The  left ventricle has moderately decreased function. The left ventricle  demonstrates regional wall motion abnormalities (see scoring  diagram/findings for description). Left ventricular  diastolic parameters are consistent with Grade I diastolic dysfunction  (impaired relaxation).  3. Right ventricular systolic function is normal. The right ventricular  size is normal.  4. The mitral valve is normal in structure. No evidence of mitral valve  regurgitation. No evidence of mitral stenosis.  5. The aortic valve was not well visualized. Aortic valve regurgitation  Not assessed.  6. The inferior vena cava is normal in size with greater than 50%  respiratory variability, suggesting right atrial pressure of 3 mmHg.     ECHO 05/03/2020  1. Impella cateter noted across the aortic valve and  into the left  ventricle. 6.5 cm from the aortic annulus. Impella tip terminates distal  in the left ventricular carvity,  2.6 cm from the apex. Compared with the  echo yesterday (02/02/22) systolic  function has improved significantly. Left ventricular ejection fraction,  by estimation, is 65 to 70%. The left ventricle has normal function. The  left ventricle has no regional wall motion abnormalities.  2. Right ventricular systolic function is normal. The right ventricular  size is normal.  3. The mitral valve is grossly normal.  4. The aortic valve was not well visualized.    Patient Profile     45 y.o. male  smoker, obese, without prior CV history, presents with resuscitated VF arrest and found to have distal LAD occlusion managed medically, recovered LVEF. Transient respiratory failure with febrile illness, possibly H. Influenzae pneumonia.    Assessment & Plan    1. VF arrest: on amio. Plan ICD tomorrow. This procedure has been fully reviewed with the patient and written informed consent has been obtained. 2. CAD: no preceding angina. Occluded distal LAD. Normal LVEF. On DAPT, statin, carvedilol, ARB, spironolactone, Wilder Glade. 3. Resp failure/pneumonia:  Resolved. 4. Prediabetes: A1c 6.1% on admission. On Farxiga for HFPEF, should improve. 5.  Musculoskeletal chest pain: I am sure that he has some rib fractures and/or injuries to the costochondral joints, but these will heal gradually, no particular medical intervention needed.  Treat with analgesics.  For questions or updates, please contact [place] Please consult [email or web address] for contact info under        Signed, Sanda Klein, MD  05/09/2020, 10:37 AM

## 2020-05-09 NOTE — Progress Notes (Signed)
Pt transferred from Bed to chair and walking to the room and to the bathroom. Complaining lots of sores on his chest, especially when he coughs his severity of pain increased. Incentive spirometer is encouraged. Will continue to monitor the pt  Lonia Farber, RN

## 2020-05-09 NOTE — Progress Notes (Signed)
Consent taken for surgery tomorrow. Surgical PCR sent, pt is aware to be NPO from midnight.  Lonia Farber, RN

## 2020-05-10 ENCOUNTER — Inpatient Hospital Stay (HOSPITAL_COMMUNITY)
Admission: EM | Disposition: A | Discharge status: Home / Self Care | Payer: Self-pay | Source: Home / Self Care | Attending: Cardiovascular Disease

## 2020-05-10 DIAGNOSIS — I469 Cardiac arrest, cause unspecified: Secondary | ICD-10-CM

## 2020-05-10 DIAGNOSIS — I255 Ischemic cardiomyopathy: Secondary | ICD-10-CM

## 2020-05-10 HISTORY — PX: ICD IMPLANT: EP1208

## 2020-05-10 LAB — CBC
HCT: 31.5 % — ABNORMAL LOW (ref 39.0–52.0)
Hemoglobin: 10.4 g/dL — ABNORMAL LOW (ref 13.0–17.0)
MCH: 31.2 pg (ref 26.0–34.0)
MCHC: 33 g/dL (ref 30.0–36.0)
MCV: 94.6 fL (ref 80.0–100.0)
Platelets: 599 10*3/uL — ABNORMAL HIGH (ref 150–400)
RBC: 3.33 MIL/uL — ABNORMAL LOW (ref 4.22–5.81)
RDW: 13.2 % (ref 11.5–15.5)
WBC: 13.7 10*3/uL — ABNORMAL HIGH (ref 4.0–10.5)
nRBC: 0 % (ref 0.0–0.2)

## 2020-05-10 LAB — GLUCOSE, CAPILLARY
Glucose-Capillary: 103 mg/dL — ABNORMAL HIGH (ref 70–99)
Glucose-Capillary: 92 mg/dL (ref 70–99)
Glucose-Capillary: 104 mg/dL — ABNORMAL HIGH (ref 70–99)
Glucose-Capillary: 107 mg/dL — ABNORMAL HIGH (ref 70–99)

## 2020-05-10 LAB — COOXEMETRY PANEL
Carboxyhemoglobin: 1.2 % (ref 0.5–1.5)
Methemoglobin: 1 % (ref 0.0–1.5)
O2 Saturation: 57.8 %
Total hemoglobin: 10.8 g/dL — ABNORMAL LOW (ref 12.0–16.0)

## 2020-05-10 LAB — BASIC METABOLIC PANEL
Anion gap: 11 (ref 5–15)
BUN: 17 mg/dL (ref 6–20)
CO2: 24 mmol/L (ref 22–32)
Calcium: 9 mg/dL (ref 8.9–10.3)
Chloride: 101 mmol/L (ref 98–111)
Creatinine, Ser: 0.99 mg/dL (ref 0.61–1.24)
GFR, Estimated: >60 mL/min (ref >60)
Glucose, Bld: 105 mg/dL — ABNORMAL HIGH (ref 70–99)
Potassium: 4.4 mmol/L (ref 3.5–5.1)
Sodium: 136 mmol/L (ref 135–145)

## 2020-05-10 SURGERY — ICD IMPLANT
Anesthesia: LOCAL

## 2020-05-10 MED ORDER — CHLORHEXIDINE GLUCONATE 4 % EX LIQD
CUTANEOUS | Status: AC
  Filled 2020-05-10: qty 60

## 2020-05-10 MED ORDER — FENTANYL CITRATE (PF) 100 MCG/2ML IJ SOLN
INTRAMUSCULAR | Status: DC | PRN
  Administered 2020-05-10 (×3): 25 ug via INTRAVENOUS

## 2020-05-10 MED ORDER — CEFAZOLIN SODIUM-DEXTROSE 2-4 GM/100ML-% IV SOLN
INTRAVENOUS | Status: AC
  Filled 2020-05-10: qty 100

## 2020-05-10 MED ORDER — SODIUM CHLORIDE 0.9 % IV SOLN
INTRAVENOUS | Status: AC
  Filled 2020-05-10: qty 2

## 2020-05-10 MED ORDER — VANCOMYCIN HCL 1000 MG IV SOLR
INTRAVENOUS | Status: AC | PRN
  Administered 2020-05-10: 1000 mg via INTRAVENOUS

## 2020-05-10 MED ORDER — ONDANSETRON HCL 4 MG/2ML IJ SOLN: 4.0000 mg | Freq: Four times a day (QID) | INTRAMUSCULAR | Status: DC | PRN

## 2020-05-10 MED ORDER — HEPARIN SODIUM (PORCINE) 1000 UNIT/ML IJ SOLN
INTRAMUSCULAR | Status: AC
  Filled 2020-05-10: qty 1

## 2020-05-10 MED ORDER — ACETAMINOPHEN 325 MG PO TABS
325.0000 mg | ORAL_TABLET | ORAL | Status: DC | PRN
Start: 2020-05-10 — End: 2020-05-11
  Administered 2020-05-10: 650 mg via ORAL
  Filled 2020-05-10: qty 2

## 2020-05-10 MED ORDER — SODIUM CHLORIDE 0.9 % IV SOLN: INTRAVENOUS | Status: DC

## 2020-05-10 MED ORDER — DIPHENHYDRAMINE HCL 50 MG/ML IJ SOLN
INTRAMUSCULAR | Status: DC | PRN
  Administered 2020-05-10: 25 mg via INTRAVENOUS

## 2020-05-10 MED ORDER — CEFAZOLIN SODIUM-DEXTROSE 2-4 GM/100ML-% IV SOLN
2.0000 g | INTRAVENOUS | Status: AC
  Administered 2020-05-10: 2 g via INTRAVENOUS

## 2020-05-10 MED ORDER — VANCOMYCIN HCL IN DEXTROSE 1-5 GM/200ML-% IV SOLN
INTRAVENOUS | Status: AC
  Filled 2020-05-10: qty 200

## 2020-05-10 MED ORDER — DIPHENHYDRAMINE HCL 50 MG/ML IJ SOLN
INTRAMUSCULAR | Status: AC
  Filled 2020-05-10: qty 1

## 2020-05-10 MED ORDER — LIDOCAINE HCL (PF) 1 % IJ SOLN
INTRAMUSCULAR | Status: AC
  Filled 2020-05-10: qty 60

## 2020-05-10 MED ORDER — CHLORHEXIDINE GLUCONATE 4 % EX LIQD
60.0000 mL | Freq: Once | CUTANEOUS | Status: AC
  Administered 2020-05-10: 4 via TOPICAL
  Filled 2020-05-10: qty 15

## 2020-05-10 MED ORDER — HEPARIN (PORCINE) IN NACL 1000-0.9 UT/500ML-% IV SOLN
INTRAVENOUS | Status: AC
  Filled 2020-05-10: qty 500

## 2020-05-10 MED ORDER — CARVEDILOL 6.25 MG PO TABS
6.2500 mg | ORAL_TABLET | Freq: Two times a day (BID) | ORAL | Status: DC
  Administered 2020-05-10: 6.25 mg via ORAL
  Filled 2020-05-10: qty 1

## 2020-05-10 MED ORDER — LIDOCAINE HCL (PF) 1 % IJ SOLN
INTRAMUSCULAR | Status: DC | PRN
  Administered 2020-05-10: 70 mL

## 2020-05-10 MED ORDER — FENTANYL CITRATE (PF) 100 MCG/2ML IJ SOLN
INTRAMUSCULAR | Status: AC
  Filled 2020-05-10: qty 2

## 2020-05-10 SURGICAL SUPPLY — 7 items
CABLE SURGICAL S-101-97-12 (CABLE) ×2 IMPLANT
ICD ACTICOR DX (ICD Generator) ×2 IMPLANT
LEAD PLEXA 65/15 (Lead) ×2 IMPLANT
MAT PREVALON FULL STRYKER (MISCELLANEOUS) ×2 IMPLANT
PAD PRO RADIOLUCENT 2001M-C (PAD) ×2 IMPLANT
SHEATH 8FR PRELUDE SNAP 13 (SHEATH) ×2 IMPLANT
TRAY PACEMAKER INSERTION (PACKS) ×2 IMPLANT

## 2020-05-10 NOTE — TOC Progression Note (Signed)
Transition of Care (TOC) - Progression Note  Heart Failure   Patient Details  Name: Tyler Jacobson MRN: [id number] Date of Birth: 1975-02-21  [facility] of [facility]) CM/SW Contact  Vianney Kopecky, LCSWA Phone Number: 05/10/2020, 12:19 PM  Clinical Narrative:    CSW spoke with patients ex-wife, Tyler Jacobson [phone] to discuss her concerns regarding the patient. Tyler Jacobson indicated that the patient called her and stated that he is leaving the hospital on Friday or maybe tomorrow and that she needs to care for him. Tyler Jacobson wanted to verify with the CSW that she is not a caregiver for the patient and needs to set boundaries for herself and she will be leaving for vacation from 05/14/20 - 05/22/20 and won't be able to provide him with assistance. CSW confirmed with the patients ex-wife that he is not discharging right now and will be having surgery soon and will need to recover from surgery. Tyler Jacobson indicated that she would like to be kept in the loop as they share a daughter together but wants the patient to make decisions for himself.  TOC will continue to follow the patient for d/c needs.      Barriers to Discharge: Continued Medical Work up  Expected Discharge Plan and Services   In-house Referral: Clinical Social Work     Living arrangements for the past 2 months: Single Family Home                                       Social Determinants of Health (SDOH) Interventions Food Insecurity Interventions: Intervention Not Indicated,Other (Comment) (This may change if patient is unable to return to work for [employer].) [employer] Interventions: Intervention Not Indicated (This may change if patient is unable to return to work for [employer]. Information obtained from patients ex-wife.) Housing Interventions: Intervention Not Indicated Transportation Interventions: Intervention Not Indicated  Readmission Risk Interventions No flowsheet data found.  Tyler Jacobson, MSW,  LCSWA [phone] Heart Failure Social Worker

## 2020-05-10 NOTE — Plan of Care (Signed)
  Problem: Safety: Goal: Non-violent Restraint(s) Outcome: Progressing   Problem: Education: Goal: Understanding of cardiac disease, CV risk reduction, and recovery process will improve Outcome: Progressing Goal: Understanding of medication regimen will improve Outcome: Progressing Goal: Individualized Educational Video(s) Outcome: Progressing   Problem: Activity: Goal: Ability to tolerate increased activity will improve Outcome: Progressing   Problem: Cardiac: Goal: Ability to achieve and maintain adequate cardiopulmonary perfusion will improve Outcome: Progressing Goal: Vascular access site(s) Level 0-1 will be maintained Outcome: Progressing   Problem: Health Behavior/Discharge Planning: Goal: Ability to safely manage health-related needs after discharge will improve Outcome: Progressing   Problem: Education: Goal: Knowledge of General Education information will improve Description: Including pain rating scale, medication(s)/side effects and non-pharmacologic comfort measures Outcome: Progressing   Problem: Health Behavior/Discharge Planning: Goal: Ability to manage health-related needs will improve Outcome: Progressing   Problem: Clinical Measurements: Goal: Ability to maintain clinical measurements within normal limits will improve Outcome: Progressing Goal: Will remain free from infection Outcome: Progressing Goal: Diagnostic test results will improve Outcome: Progressing Goal: Respiratory complications will improve Outcome: Progressing Goal: Cardiovascular complication will be avoided Outcome: Progressing   Problem: Activity: Goal: Risk for activity intolerance will decrease Outcome: Progressing   Problem: Nutrition: Goal: Adequate nutrition will be maintained Outcome: Progressing   Problem: Coping: Goal: Level of anxiety will decrease Outcome: Progressing   Problem: Elimination: Goal: Will not experience complications related to bowel  motility Outcome: Progressing Goal: Will not experience complications related to urinary retention Outcome: Progressing   Problem: Pain Managment: Goal: General experience of comfort will improve Outcome: Progressing   Problem: Safety: Goal: Ability to remain free from injury will improve Outcome: Progressing   Problem: Skin Integrity: Goal: Risk for impaired skin integrity will decrease Outcome: Progressing   

## 2020-05-10 NOTE — Progress Notes (Addendum)
Patient ID: Tyler Jacobson, male   DOB: 1975-11-22, 45 y.o.   MRN: [id number]     Advanced Heart Failure Rounding Note  PCP-Cardiologist: No primary care provider on file.   Subjective:    Impella/Swan removed 4/25.  4/28 Extubated.   Echo: EF 30-35% with apical akinesis.  Repeat echo (4/25): EF >60%, normal RV  Had H influenzae PNA, was on ceftriaxone until 4/30.   Still has chest pain with deep breathing/cough.    Objective:   Weight Range: 103.8 kg Body mass index is 35.84 kg/m.   Vital Signs:   Temp:  [98.2 F (36.8 C)-98.8 F (37.1 C)] 98.6 F (37 C) (05/02 0317) Pulse Rate:  [72-83] 75 (05/02 0317) Resp:  [17-22] 17 (05/02 0317) BP: (110-135)/(68-88) 135/88 (05/02 0317) SpO2:  [93 %-97 %] 94 % (05/01 2305) Last BM Date: 05/09/20  Weight change: Filed Weights   05/05/20 0300 05/06/20 0157 05/07/20 0500  Weight: 104.1 kg 102.9 kg 103.8 kg    Intake/Output:   Intake/Output Summary (Last 24 hours) at 05/10/2020 0802 Last data filed at 05/10/2020 0317 Gross per 24 hour  Intake 690 ml  Output 1800 ml  Net -1110 ml      Physical Exam   General: NAD Neck: No JVD, no thyromegaly or thyroid nodule.  Lungs: Clear to auscultation bilaterally with normal respiratory effort. CV: Nondisplaced PMI.  Heart regular S1/S2, no S3/S4, no murmur.  No peripheral edema.   Abdomen: Soft, nontender, no hepatosplenomegaly, no distention.  Skin: Intact without lesions or rashes.  Neurologic: Alert and oriented x 3.  Psych: Normal affect. Extremities: No clubbing or cyanosis.  HEENT: Normal.    Telemetry   NSR 70-80s    Labs    CBC Recent Labs    05/09/20 0352 05/10/20 0330  WBC 11.6* 13.7*  HGB 9.8* 10.4*  HCT 29.7* 31.5*  MCV 94.9 94.6  PLT 468* 010*   Basic Metabolic Panel Recent Labs    05/09/20 0352 05/10/20 0330  NA 136 136  K 4.2 4.4  CL 99 101  CO2 27 24  GLUCOSE 91 105*  BUN 20 17  CREATININE 1.02 0.99  CALCIUM 8.9 9.0   Liver Function  Tests No results for input(s): AST, ALT, ALKPHOS, BILITOT, PROT, ALBUMIN in the last 72 hours. No results for input(s): LIPASE, AMYLASE in the last 72 hours. Cardiac Enzymes No results for input(s): CKTOTAL, CKMB, CKMBINDEX, TROPONINI in the last 72 hours.  BNP: BNP (last 3 results) No results for input(s): BNP in the last 8760 hours.  ProBNP (last 3 results) No results for input(s): PROBNP in the last 8760 hours.   D-Dimer No results for input(s): DDIMER in the last 72 hours. Hemoglobin A1C No results for input(s): HGBA1C in the last 72 hours. Fasting Lipid Panel No results for input(s): CHOL, HDL, LDLCALC, TRIG, CHOLHDL, LDLDIRECT in the last 72 hours. Thyroid Function Tests No results for input(s): TSH, T4TOTAL, T3FREE, THYROIDAB in the last 72 hours.  Invalid input(s): FREET3  Other results:   Imaging    No results found.   Medications:     Scheduled Medications: . amiodarone  200 mg Oral BID  . aspirin  81 mg Oral Daily  . atorvastatin  80 mg Oral Daily  . carvedilol  6.25 mg Oral BID WC  . chlorhexidine  60 mL Topical Once  . chlorhexidine gluconate (MEDLINE KIT)  15 mL Mouth Rinse BID  . Chlorhexidine Gluconate Cloth  6 each Topical Daily  .  Chlorhexidine Gluconate Cloth  6 each Topical Q0600  . clonazePAM  0.5 mg Oral TID  . dapagliflozin propanediol  10 mg Oral Daily  . docusate sodium  100 mg Oral BID  . enoxaparin (LOVENOX) injection  40 mg Subcutaneous Q24H  . feeding supplement  1 Container Oral TID BM  . gentamicin irrigation  80 mg Irrigation To SS-Surg  . insulin aspart  0-15 Units Subcutaneous Q4H  . losartan  12.5 mg Oral Daily  . mouth rinse  15 mL Mouth Rinse BID  . multivitamin with minerals  1 tablet Oral Daily  . mupirocin ointment  1 application Nasal BID  . polyethylene glycol  17 g Oral BID  . sodium chloride flush  10-40 mL Intracatheter Q12H  . sodium chloride flush  10-40 mL Intracatheter Q12H  . sodium chloride flush  3 mL  Intravenous Q12H    Infusions: . sodium chloride    . sodium chloride Stopped (05/04/20 1212)  . sodium chloride 50 mL/hr at 05/10/20 0639  . sodium chloride    . sodium chloride    .  ceFAZolin (ANCEF) IV      PRN Medications: sodium chloride, sodium chloride, acetaminophen, artificial tears, bisacodyl, nitroGLYCERIN, oxyCODONE, sodium chloride flush, sodium chloride flush, sodium chloride flush, traMADol   Assessment/Plan   1. CAD: Anterior STEMI.  Occlusion of distal/apical LAD on cath.  Small caliber vessel, no intervention.  - Continue ASA 81  - Ticagrelor stopped for ICD placement (no intervention at time of MI), can be restarted afterwards when cleared by EP.  - Continue statin.  2. Cardiac arrest: VF due to STEMI.  No rhythm issues. Rhythm stable here.  - Now on amiodarone 200 mg po bid, after ICD placed can probably stop amiodarone after a month or 2.   - ICD for secondary prevention, to be placed today.   3. Cardiogenic shock:  Echo with EF 30-35%, apical akinesis => repeat improved to >60%.  Impella out 4/25.   - Can continue Farxiga and losartan.  - With EF recovered, would simplify regimen and stop spironolactone + increase Coreg to 6.25 mg bid.  4. AKI: Resolved.    5. ID: PNA with H influenzae on trach aspirate.  He had a full course of antibiotic treatment.  6. Chest pain: Pleuritic post-CPR, suspect rib pain.   Loralie Champagne 05/10/2020 8:02 AM

## 2020-05-10 NOTE — Progress Notes (Signed)
Electrophysiology Rounding Note  Patient Name: Tyler Jacobson Date of Encounter: 05/10/2020  Primary Cardiologist: No primary care provider on file. Electrophysiologist: Dr. Quentin Ore   Subjective   The patient is doing well today.  At this time, the patient denies chest pain, shortness of breath, or any new concerns.  He remains sore from CPR. Denies SOB. Nervous about MRSA nasal PCR  Inpatient Medications    Scheduled Meds: . amiodarone  200 mg Oral BID  . aspirin  81 mg Oral Daily  . atorvastatin  80 mg Oral Daily  . carvedilol  3.125 mg Oral BID WC  . chlorhexidine  60 mL Topical Once  . chlorhexidine gluconate (MEDLINE KIT)  15 mL Mouth Rinse BID  . Chlorhexidine Gluconate Cloth  6 each Topical Daily  . Chlorhexidine Gluconate Cloth  6 each Topical Q0600  . clonazePAM  0.5 mg Oral TID  . dapagliflozin propanediol  10 mg Oral Daily  . docusate sodium  100 mg Oral BID  . enoxaparin (LOVENOX) injection  40 mg Subcutaneous Q24H  . feeding supplement  1 Container Oral TID BM  . gentamicin irrigation  80 mg Irrigation To SS-Surg  . insulin aspart  0-15 Units Subcutaneous Q4H  . losartan  12.5 mg Oral Daily  . mouth rinse  15 mL Mouth Rinse BID  . multivitamin with minerals  1 tablet Oral Daily  . mupirocin ointment  1 application Nasal BID  . polyethylene glycol  17 g Oral BID  . sodium chloride flush  10-40 mL Intracatheter Q12H  . sodium chloride flush  10-40 mL Intracatheter Q12H  . sodium chloride flush  3 mL Intravenous Q12H  . spironolactone  12.5 mg Oral Daily   Continuous Infusions: . sodium chloride    . sodium chloride Stopped (05/04/20 1212)  . sodium chloride 50 mL/hr at 05/10/20 0639  . sodium chloride    . sodium chloride    .  ceFAZolin (ANCEF) IV     PRN Meds: sodium chloride, sodium chloride, acetaminophen, artificial tears, bisacodyl, nitroGLYCERIN, oxyCODONE, sodium chloride flush, sodium chloride flush, sodium chloride flush, traMADol   Vital  Signs    Vitals:   05/09/20 1600 05/09/20 1924 05/09/20 2305 05/10/20 0317  BP: 121/73 123/78 127/87 135/88  Pulse: 72 76 73 75  Resp:  (!) _0 Temp:  98.4 F (36.9 C) 98.8 F (37.1 C) 98.6 F (37 C)  TempSrc:  Oral Oral Oral  SpO2: 94% 97% 94%   Weight:      Height:        Intake/Output Summary (Last 24 hours) at 05/10/2020 0730 Last data filed at 05/10/2020 0317 Gross per 24 hour  Intake 810 ml  Output 1800 ml  Net -990 ml   Filed Weights   05/05/20 0300 05/06/20 0157 05/07/20 0500  Weight: 104.1 kg 102.9 kg 103.8 kg    Physical Exam    GEN- The patient is well appearing, alert and oriented x 3 today.   Head- normocephalic, atraumatic Eyes-  Sclera clear, conjunctiva pink Ears- hearing intact Oropharynx- clear Neck- supple Lungs- Clear to ausculation bilaterally, normal work of breathing Heart- Regular rate and rhythm, no murmurs, rubs or gallops GI- soft, NT, ND, + BS Extremities- no clubbing or cyanosis. No edema Skin- no rash or lesion Psych- euthymic mood, full affect Neuro- strength and sensation are intact  Labs    CBC Recent Labs    05/09/20 0352 05/10/20 0330  WBC 11.6* 13.7*  HGB  9.8* 10.4*  HCT 29.7* 31.5*  MCV 94.9 94.6  PLT 468* 433*   Basic Metabolic Panel Recent Labs    05/09/20 0352 05/10/20 0330  NA 136 136  K 4.2 4.4  CL 99 101  CO2 27 24  GLUCOSE 91 105*  BUN 20 17  CREATININE 1.02 0.99  CALCIUM 8.9 9.0   Liver Function Tests No results for input(s): AST, ALT, ALKPHOS, BILITOT, PROT, ALBUMIN in the last 72 hours. No results for input(s): LIPASE, AMYLASE in the last 72 hours. Cardiac Enzymes No results for input(s): CKTOTAL, CKMB, CKMBINDEX, TROPONINI in the last 72 hours.   Telemetry    NSR 70-80s (personally reviewed)  Radiology    No results found.  Patient Profile     Tyler Jacobson is a 45 y.o. male with no prior PMHX outside of obesity and active smoker who is being seen today for the evaluation of  post cardiac arrest EP evaluation at the request of Dr. Aundra Dubin for consideration of ICD implant.  Assessment & Plan    1. Cardiac arrest     S/p AED shock >> EMS PEA > VF shocked again w/ACLS  LVEF initially 30-35% >> 60% (both with impella) Cath with distal LAD occlusion, to small for intervention Amiodarone gtt started day of admission and transitioned to oral yesterday, 220m BID On: ASA/brilinta Coreg and losartan started yesterday  S/p STEMI with no coronary intervention Narrow QRS  Plan for ICD today for secondary prevention. Explained risks, benefits, and alternatives to ICD implantation, including but not limited to bleeding, infection, pneumothorax, pericardial effusion, lead dislodgement, heart attack, stroke, or death.  Pt verbalized understanding and agrees to proceed.   For questions or updates, please contact [email or web address] consult [email or web address] for contact info under Cardiology/STEMI.  Signed, MShirley Friar PA-C  05/10/2020, 7:30 AM
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
## 2020-05-10 NOTE — Progress Notes (Signed)
RUA PICC removed per order for ICD placement today.  Site CDI without signs of infection or bleeding.  Vaseline guaze and dry 2x2 applied to site.  RN at bedside aware of activity restrictions.

## 2020-05-10 NOTE — Progress Notes (Signed)
CARDIAC REHAB PHASE I   PRE:  Rate/Rhythm: 77 SR  BP:  Supine:   Sitting: 147/97  Standing:    SaO2: 95%RA  MODE:  Ambulation: 400 ft   POST:  Rate/Rhythm: 98 SR  BP:  Supine:   Sitting: 130/89  Standing:    SaO2: 97%RA [id number] Pt walked 400 ft on RA with rolling walker and minimal asst. Helped pt get cleaned up a bit and then changed his bed. . Back to bed after walk. Pt did not sleep well last night. Gait steady. Coughing up productively.   Luetta Nutting, RN BSN  05/10/2020 9:13 AM

## 2020-05-10 NOTE — Progress Notes (Signed)
SLP Cancellation Note  Patient Details Name: Tyler Jacobson MRN: [id number] DOB: 1975/09/24   Cancelled treatment:       Reason Eval/Treat Not Completed: Medical issues which prohibited therapy (pt NPO for procedure planned for today). Will f/u for swallowing tx as able.    Mahala Menghini., M.A. CCC-SLP Acute Rehabilitation Services Pager [phone] Office [phone]  05/10/2020, 8:16 AM

## 2020-05-11 ENCOUNTER — Encounter (HOSPITAL_COMMUNITY): Payer: Self-pay | Admitting: Cardiology

## 2020-05-11 ENCOUNTER — Inpatient Hospital Stay (HOSPITAL_COMMUNITY): Payer: BC Managed Care – PPO

## 2020-05-11 ENCOUNTER — Other Ambulatory Visit (HOSPITAL_COMMUNITY): Payer: Self-pay

## 2020-05-11 DIAGNOSIS — Z95810 Presence of automatic (implantable) cardiac defibrillator: Secondary | ICD-10-CM

## 2020-05-11 LAB — CBC
HCT: 33.9 % — ABNORMAL LOW (ref 39.0–52.0)
Hemoglobin: 11.3 g/dL — ABNORMAL LOW (ref 13.0–17.0)
MCH: 30.9 pg (ref 26.0–34.0)
MCHC: 33.3 g/dL (ref 30.0–36.0)
MCV: 92.6 fL (ref 80.0–100.0)
Platelets: 698 10*3/uL — ABNORMAL HIGH (ref 150–400)
RBC: 3.66 MIL/uL — ABNORMAL LOW (ref 4.22–5.81)
RDW: 13 % (ref 11.5–15.5)
WBC: 13.4 10*3/uL — ABNORMAL HIGH (ref 4.0–10.5)
nRBC: 0 % (ref 0.0–0.2)

## 2020-05-11 LAB — BASIC METABOLIC PANEL
Anion gap: 12 (ref 5–15)
BUN: 17 mg/dL (ref 6–20)
CO2: 25 mmol/L (ref 22–32)
Calcium: 9.4 mg/dL (ref 8.9–10.3)
Chloride: 100 mmol/L (ref 98–111)
Creatinine, Ser: 1.09 mg/dL (ref 0.61–1.24)
GFR, Estimated: >60 mL/min (ref >60)
Glucose, Bld: 124 mg/dL — ABNORMAL HIGH (ref 70–99)
Potassium: 4.5 mmol/L (ref 3.5–5.1)
Sodium: 137 mmol/L (ref 135–145)

## 2020-05-11 LAB — GLUCOSE, CAPILLARY: Glucose-Capillary: 135 mg/dL — ABNORMAL HIGH (ref 70–99)

## 2020-05-11 MED ORDER — ATORVASTATIN CALCIUM 80 MG PO TABS
80.0000 mg | ORAL_TABLET | Freq: Every day | ORAL | 5 refills | Status: DC
  Filled 2020-05-11: qty 30, 30d supply, fill #0

## 2020-05-11 MED ORDER — DAPAGLIFLOZIN PROPANEDIOL 10 MG PO TABS
10.0000 mg | ORAL_TABLET | Freq: Every day | ORAL | 5 refills | Status: DC
  Filled 2020-05-11: qty 30, 30d supply, fill #0

## 2020-05-11 MED ORDER — CARVEDILOL 6.25 MG PO TABS
6.2500 mg | ORAL_TABLET | Freq: Two times a day (BID) | ORAL | 5 refills | Status: DC
  Filled 2020-05-11: qty 60, 30d supply, fill #0

## 2020-05-11 MED ORDER — ASPIRIN 81 MG PO TBEC
81.0000 mg | DELAYED_RELEASE_TABLET | Freq: Every day | ORAL | 11 refills | Status: DC
  Filled 2020-05-11: qty 30, 30d supply, fill #0

## 2020-05-11 MED ORDER — LOSARTAN POTASSIUM 25 MG PO TABS
12.5000 mg | ORAL_TABLET | Freq: Two times a day (BID) | ORAL | Status: DC
  Administered 2020-05-11: 12.5 mg via ORAL
  Filled 2020-05-11: qty 1

## 2020-05-11 MED ORDER — CARVEDILOL 6.25 MG PO TABS: 6.2500 mg | ORAL_TABLET | Freq: Two times a day (BID) | ORAL | Status: DC

## 2020-05-11 MED ORDER — DAPAGLIFLOZIN PROPANEDIOL 10 MG PO TABS
10.0000 mg | ORAL_TABLET | Freq: Every day | ORAL | Status: DC
  Administered 2020-05-11: 10 mg via ORAL
  Filled 2020-05-11: qty 1

## 2020-05-11 MED ORDER — LOSARTAN POTASSIUM 25 MG PO TABS
12.5000 mg | ORAL_TABLET | Freq: Two times a day (BID) | ORAL | 5 refills | Status: DC
  Filled 2020-05-11: qty 30, 30d supply, fill #0

## 2020-05-11 MED ORDER — AMIODARONE HCL 200 MG PO TABS
200.0000 mg | ORAL_TABLET | Freq: Every day | ORAL | 0 refills | Status: DC
  Filled 2020-05-11: qty 30, 30d supply, fill #0

## 2020-05-11 MED ORDER — TICAGRELOR 90 MG PO TABS: 90.0000 mg | ORAL_TABLET | Freq: Two times a day (BID) | ORAL | Status: DC

## 2020-05-11 MED ORDER — TICAGRELOR 90 MG PO TABS
90.0000 mg | ORAL_TABLET | Freq: Two times a day (BID) | ORAL | 11 refills | Status: DC
  Filled 2020-05-11: qty 60, 30d supply, fill #0

## 2020-05-11 MED ORDER — AMIODARONE HCL 200 MG PO TABS
200.0000 mg | ORAL_TABLET | Freq: Every day | ORAL | Status: DC
  Administered 2020-05-11: 200 mg via ORAL
  Filled 2020-05-11: qty 1

## 2020-05-11 MED ORDER — ASPIRIN EC 81 MG PO TBEC
81.0000 mg | DELAYED_RELEASE_TABLET | Freq: Every day | ORAL | Status: DC
  Administered 2020-05-11: 81 mg via ORAL
  Filled 2020-05-11: qty 1

## 2020-05-11 MED ORDER — ATORVASTATIN CALCIUM 80 MG PO TABS
80.0000 mg | ORAL_TABLET | Freq: Every day | ORAL | Status: DC
  Administered 2020-05-11: 80 mg via ORAL
  Filled 2020-05-11: qty 1
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
## 2020-05-11 NOTE — Plan of Care (Signed)
  Problem: Education: Goal: Understanding of cardiac disease, CV risk reduction, and recovery process will improve Outcome: Progressing Goal: Understanding of medication regimen will improve Outcome: Progressing   Problem: Activity: Goal: Ability to tolerate increased activity will improve Outcome: Progressing   Problem: Cardiac: Goal: Ability to achieve and maintain adequate cardiopulmonary perfusion will improve Outcome: Progressing Goal: Vascular access site(s) Level 0-1 will be maintained Outcome: Progressing

## 2020-05-11 NOTE — Progress Notes (Signed)
CARDIAC REHAB PHASE I   Reinforced MI education with pt. Provided support and encouragement. Pt denies questions or concerns at this time. Referred to [facility] [place].  [id number] Reynold Bowen, RN BSN 05/11/2020 10:22 AM

## 2020-05-11 NOTE — Progress Notes (Signed)
   Electrophysiology Rounding Note  Patient Name: Tyler Jacobson Date of Encounter: 05/11/2020  Primary Cardiologist: New Electrophysiologist: Dr. Lalla Brothers   Subjective   The patient is doing well today.  At this time, the patient denies chest pain, shortness of breath, or any new concerns.  Inpatient Medications    Scheduled Meds:  Continuous Infusions:  PRN Meds: acetaminophen, ondansetron (ZOFRAN) IV   Vital Signs    Vitals:   05/10/20 2015 05/10/20 2030 05/10/20 2300 05/11/20 0300  BP: 127/80 129/79 135/83 (!) 146/96  Pulse:  76 72 68  Resp:  (!) 21 20 15   Temp:  97.8 F (36.6 C) 97.9 F (36.6 C) 97.8 F (36.6 C)  TempSrc:  Oral Oral Oral  SpO2:  94% 96% 99%  Weight:      Height:       No intake or output data in the 24 hours ending 05/11/20 0712 Filed Weights   05/05/20 0300 05/06/20 0157 05/07/20 0500  Weight: 104.1 kg 102.9 kg 103.8 kg    Physical Exam    GEN- The patient is well appearing, alert and oriented x 3 today.   Head- normocephalic, atraumatic Eyes-  Sclera clear, conjunctiva pink Ears- hearing intact Oropharynx- clear Neck- supple Lungs- Clear to ausculation bilaterally, normal work of breathing Heart- Regular rate and rhythm, no murmurs, rubs or gallops GI- soft, NT, ND, + BS Extremities- no clubbing or cyanosis. No edema Skin- no rash or lesion Psych- euthymic mood, full affect Neuro- strength and sensation are intact  Labs    CBC Recent Labs    05/09/20 0352 05/10/20 0330  WBC 11.6* 13.7*  HGB 9.8* 10.4*  HCT 29.7* 31.5*  MCV 94.9 94.6  PLT 468* 599*   Basic Metabolic Panel Recent Labs    07/10/20 0352 05/10/20 0330  NA 136 136  K 4.2 4.4  CL 99 101  CO2 27 24  GLUCOSE 91 105*  BUN 20 17  CREATININE 1.02 0.99  CALCIUM 8.9 9.0   Liver Function Tests No results for input(s): AST, ALT, ALKPHOS, BILITOT, PROT, ALBUMIN in the last 72 hours. No results for input(s): LIPASE, AMYLASE in the last 72 hours. Cardiac  Enzymes No results for input(s): CKTOTAL, CKMB, CKMBINDEX, TROPONINI in the last 72 hours.   Telemetry    NSR 70-80s (personally reviewed)  Radiology    No results found.  Patient Profile     Tyler Jacobson a 45 y.o.malewith no prior PMHX outside of obesity and active smokerwho is being seen today for the evaluation of post cardiac arrest EP evaluationat the request of Dr. 45 for consideration of ICD implant.  Assessment & Plan    1. Cardiac arrest s/p Single lead Biotronik ICD CXR with stable lead placement and no pneumothorax Device interrogation stable.  Can resume Brilinta tomorrow evening Usual follow up and wound restrictions reviewed with patient.   For questions or updates, please contact [facility] Please consult [email or web address] for contact info under Cardiology/STEMI.  Signed, Shirlee Latch, PA-C  05/11/2020, 7:12 AM

## 2020-05-11 NOTE — Plan of Care (Signed)
  Problem: Safety: Goal: Non-violent Restraint(s) Outcome: Progressing   Problem: Education: Goal: Understanding of cardiac disease, CV risk reduction, and recovery process will improve Outcome: Progressing Goal: Understanding of medication regimen will improve Outcome: Progressing Goal: Individualized Educational Video(s) Outcome: Progressing   Problem: Activity: Goal: Ability to tolerate increased activity will improve Outcome: Progressing   Problem: Cardiac: Goal: Ability to achieve and maintain adequate cardiopulmonary perfusion will improve Outcome: Progressing Goal: Vascular access site(s) Level 0-1 will be maintained Outcome: Progressing   Problem: Health Behavior/Discharge Planning: Goal: Ability to safely manage health-related needs after discharge will improve Outcome: Progressing   Problem: Education: Goal: Knowledge of General Education information will improve Description: Including pain rating scale, medication(s)/side effects and non-pharmacologic comfort measures Outcome: Progressing   Problem: Health Behavior/Discharge Planning: Goal: Ability to manage health-related needs will improve Outcome: Progressing   Problem: Clinical Measurements: Goal: Ability to maintain clinical measurements within normal limits will improve Outcome: Progressing Goal: Will remain free from infection Outcome: Progressing Goal: Diagnostic test results will improve Outcome: Progressing Goal: Respiratory complications will improve Outcome: Progressing Goal: Cardiovascular complication will be avoided Outcome: Progressing   Problem: Activity: Goal: Risk for activity intolerance will decrease Outcome: Progressing   Problem: Nutrition: Goal: Adequate nutrition will be maintained Outcome: Progressing   Problem: Coping: Goal: Level of anxiety will decrease Outcome: Progressing   Problem: Elimination: Goal: Will not experience complications related to bowel  motility Outcome: Progressing Goal: Will not experience complications related to urinary retention Outcome: Progressing   Problem: Pain Managment: Goal: General experience of comfort will improve Outcome: Progressing   Problem: Safety: Goal: Ability to remain free from injury will improve Outcome: Progressing   Problem: Skin Integrity: Goal: Risk for impaired skin integrity will decrease Outcome: Progressing   
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
## 2020-05-11 NOTE — Discharge Instructions (Signed)
After Your ICD (Implantable Cardiac Defibrillator)   . You have a Biotronik ICD  ACTIVITY . Do not lift your arm above shoulder height for 1 week after your procedure. After 7 days, you may progress as below.  . You should remove your sling 24 hours after your procedure, unless otherwise instructed by your provider.     Tuesday May 18, 2020  Wednesday May 19, 2020 Thursday May 20, 2020 Friday May 21, 2020   . Do not lift, push, pull, or carry anything over 10 pounds with the affected arm until 6 weeks (Tuesday June 22, 2020 ) after your procedure.   . You may not drive for 6 months per [place] guidelines after cardiac arrest.  . Ask your healthcare provider when you can go back to work   INCISION/Dressing . If you are on a blood thinner such as Coumadin, Xarelto, Eliquis, Plavix, or Pradaxa please confirm with your provider when this should be resumed.   . If large square, outer bandage is left in place, this can be removed after 24 hours from your procedure. Do not remove steri-strips or glue as below.   . Monitor your defibrillator site for redness, swelling, and drainage. Call the device clinic at [phone] if you experience these symptoms or fever/chills.  . If your incision is sealed with Steri-strips or staples, you may shower 7 days after your procedure or when told by your provider. Do not remove the steri-strips or let the shower hit directly on your site. You may wash around your site with soap and water.    Marland Kitchen Avoid lotions, ointments, or perfumes over your incision until it is well-healed.  . You may use a hot tub or a pool AFTER your wound check appointment if the incision is completely closed.  . Your ICD is designed to protect you from life threatening heart rhythms. Because of this, you may receive a shock.   o 1 shock with no symptoms:  Call the office during business hours. o 1 shock with symptoms (chest pain, chest pressure, dizziness, lightheadedness, shortness  of breath, overall feeling unwell):  Call 911. o If you experience 2 or more shocks in 24 hours:  Call 911. o If you receive a shock, you should not drive for 6 months per the [place] DMV IF you receive appropriate therapy from your ICD.   . ICD Alerts:  Some alerts are vibratory and others beep. These are NOT emergencies. Please call our office to let us know. If this occurs at night or on weekends, it can wait until the next business day. Send a remote transmission.  . If your device is capable of reading fluid status (for heart failure), you will be offered monthly monitoring to review this with you.   DEVICE MANAGEMENT . Remote monitoring is used to monitor your ICD from home. This monitoring is scheduled every 91 days by our office. It allows [place] to keep an eye on the functioning of your device to ensure it is working properly. You will routinely see your Electrophysiologist annually (more often if necessary).   . You should receive your ID card for your new device in 4-8 weeks. Keep this card with you at all times once received. Consider wearing a medical alert bracelet or necklace.  . Your ICD  may be MRI compatible. This will be discussed at your next office visit/wound check.  You should avoid contact with strong electric or magnetic fields.    Do not use amateur (  ham) radio equipment or electric (arc) welding torches. MP3 player headphones with magnets should not be used. Some devices are safe to use if held at least 12 inches (30 cm) from your defibrillator. These include power tools, lawn mowers, and speakers. If you are unsure if something is safe to use, ask your health care provider.   When using your cell phone, hold it to the ear that is on the opposite side from the defibrillator. Do not leave your cell phone in a pocket over the defibrillator.   You may safely use electric blankets, heating pads, computers, and microwave ovens.  Call the office right away if:  You have chest  pain.  You feel more than one shock.  You feel more short of breath than you have felt before.  You feel more light-headed than you have felt before.  Your incision starts to open up.  This information is not intended to replace advice given to you by your health care provider. Make sure you discuss any questions you have with your health care provider.

## 2020-05-11 NOTE — Progress Notes (Signed)
RN removed pt's IV and went over d/c info. Pt's friend to transport pt home. Pt belongings with pt, including meds from pharmacy. NT transported pt to private vehicle.

## 2020-05-14 ENCOUNTER — Other Ambulatory Visit (HOSPITAL_BASED_OUTPATIENT_CLINIC_OR_DEPARTMENT_OTHER): Payer: Self-pay

## 2020-05-14 ENCOUNTER — Other Ambulatory Visit (HOSPITAL_COMMUNITY): Payer: Self-pay

## 2020-05-14 ENCOUNTER — Telehealth (HOSPITAL_BASED_OUTPATIENT_CLINIC_OR_DEPARTMENT_OTHER): Payer: Self-pay

## 2020-05-14 MED FILL — Heparin Sod (Porcine)-NaCl IV Soln 1000 Unit/500ML-0.9%: INTRAVENOUS | Qty: 500 | Status: AC

## 2020-05-14 NOTE — Telephone Encounter (Signed)
Pharmacy Transitions of Care Follow-up Telephone Call  Date of discharge: 05/11/20 Discharge Diagnosis: STEMI  How have you been since you were released from the hospital? Patient reports feeling up and down and some soreness.   Medication changes made at discharge:  - START: Brilinta, carvedilol, low dose aspirin, atorvastatin, amiodarone, Farxiga, losartan  Medication changes verified by the patient? Yes    Medication Accessibility:  Home [facility]: [facility] on [address] [phone]   Was the patient provided with refills on discharged medications? Yes    Have all prescriptions been transferred from [facility] to home pharmacy? Yes  Is the patient able to afford medications? Yes  . Notable copays: $10 copay for Brilinta with his insurance  . Eligible patient assistance: $5 copay card for Brilinta     Medication Review: TICAGRELOR (BRILINTA) Ticagrelor 90 mg BID initiated on 05/12/2020.  - Educated patient on expected duration of therapy of aspirin with ticagrelor. - Discussed importance of taking medication around the same time every day, - Reviewed potential DDIs with patient - Advised patient of medications to avoid (NSAIDs, aspirin maintenance doses>100 mg daily) - Educated that Tylenol (acetaminophen) will be the preferred analgesic to prevent risk of bleeding  - Emphasized importance of monitoring for signs and symptoms of bleeding (abnormal bruising, prolonged bleeding, nose bleeds, bleeding from gums, discolored urine, black tarry stools)  - Educated patient to notify doctor if shortness of breath or abnormal heartbeat occur - Advised patient to alert all providers of antiplatelet therapy prior to starting a new medication or having a procedure   Follow-up Appointments:  Patient has a follow-up appointment on 05/25/20.  If their condition worsens, is the pt aware to call PCP or go to the Emergency Dept.? Yes  Final Patient Assessment: Patient feels comfortable  with medications and did not have any questions or concerns.

## 2020-05-17 ENCOUNTER — Telehealth (HOSPITAL_COMMUNITY): Payer: Self-pay

## 2020-05-17 NOTE — Telephone Encounter (Signed)
Called and spoke with pt in regards to CR, pt stated he is not interested at this time.   Closed referral 

## 2020-05-17 NOTE — Telephone Encounter (Signed)
Pt insurance is active and benefits verified through [place]. Co-pay $0.00, DED $3,000.00/$2,550.44 met, out of pocket $4,500.00/$2,590.44 met, co-insurance 0%. No pre-authorization required. Ryan/BCBS, 05/17/20 @ 2:47PM, MEQ#[id number]  Will contact patient to see if he is interested in the Cardiac Rehab Program. If interested, patient will need to complete follow up appt. Once completed, patient will be contacted for scheduling upon review by the RN Navigator.

## 2020-05-20 ENCOUNTER — Ambulatory Visit (INDEPENDENT_AMBULATORY_CARE_PROVIDER_SITE_OTHER): Payer: BC Managed Care – PPO | Admitting: Emergency Medicine

## 2020-05-20 ENCOUNTER — Other Ambulatory Visit: Payer: Self-pay

## 2020-05-20 DIAGNOSIS — Z95810 Presence of automatic (implantable) cardiac defibrillator: Secondary | ICD-10-CM

## 2020-05-20 DIAGNOSIS — I469 Cardiac arrest, cause unspecified: Secondary | ICD-10-CM | POA: Diagnosis not present

## 2020-05-20 LAB — CUP PACEART INCLINIC DEVICE CHECK
Battery Voltage: 3.11 V
Brady Statistic RV Percent Paced: 0 %
Date Time Interrogation Session: 20220512160703
HighPow Impedance: 72 Ohm
Implantable Lead Implant Date: 20220502
Implantable Lead Location: 753860
Implantable Lead Model: 436909
Implantable Lead Serial Number: 81384798
Implantable Pulse Generator Implant Date: 20220502
Lead Channel Impedance Value: 599 Ohm
Lead Channel Pacing Threshold Amplitude: 0.7 V
Lead Channel Pacing Threshold Pulse Width: 0.4 ms
Lead Channel Sensing Intrinsic Amplitude: 16.1 mV
Lead Channel Sensing Intrinsic Amplitude: 17.5 mV
Lead Channel Setting Pacing Amplitude: 3 V
Lead Channel Setting Pacing Pulse Width: 0.4 ms
Lead Channel Setting Sensing Sensitivity: 0.8 mV
Pulse Gen Model: 429525
Pulse Gen Serial Number: 84818808

## 2020-05-20 NOTE — Progress Notes (Signed)
Wound check appointment. Steri-strips removed. Wound without redness or edema. Incision edges approximated, wound well healed. Normal device function. Thresholds, sensing, and impedances consistent with implant measurements. Device programmed at 3.5V for extra safety margin until 3 month visit. Histogram distribution appropriate for patient and level of activity. No  ventricular arrhythmias noted. Patient educated about wound care, arm mobility, lifting restrictions, shock plan. ROV in 3 months with implanting physician. Follow-up with Dr Lalla Brothers on 08/17/20. Reports concern for soreness in sternum and both sides of his rib cage when he, coughs, sneezes or moves. Education done on trauma related to CPR and that it takes time to heal. Patient reports concern for change in near vision after cardiac arrest. Advised patient to follow-up with PCP and opthamologist concerning vision issues.

## 2020-05-20 NOTE — Patient Instructions (Signed)
No lifting , pushing and pulling more than 10 pounds until 06/21/20. Per [place] DMV regulations you are not to drive until 44/31/54. If you have any forms or paperwork that needs to be completed you can contact [facility] at the phone # and address below.   [facility] [address] [place] , [place] [address] Phone: [phone] Fax : [phone]
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
## 2020-05-25 ENCOUNTER — Other Ambulatory Visit: Payer: Self-pay

## 2020-05-25 ENCOUNTER — Ambulatory Visit (HOSPITAL_COMMUNITY)
Admit: 2020-05-25 | Discharge: 2020-05-25 | Disposition: A | Discharge status: Home / Self Care | Payer: BC Managed Care – PPO | Attending: Adult Health | Admitting: Adult Health

## 2020-05-25 VITALS — BP 118/80 | HR 71 | Wt 242.6 lb

## 2020-05-25 DIAGNOSIS — Z95810 Presence of automatic (implantable) cardiac defibrillator: Secondary | ICD-10-CM | POA: Insufficient documentation

## 2020-05-25 DIAGNOSIS — I5022 Chronic systolic (congestive) heart failure: Secondary | ICD-10-CM | POA: Diagnosis not present

## 2020-05-25 DIAGNOSIS — I110 Hypertensive heart disease with heart failure: Secondary | ICD-10-CM | POA: Diagnosis not present

## 2020-05-25 DIAGNOSIS — I469 Cardiac arrest, cause unspecified: Secondary | ICD-10-CM | POA: Diagnosis not present

## 2020-05-25 DIAGNOSIS — E669 Obesity, unspecified: Secondary | ICD-10-CM | POA: Diagnosis not present

## 2020-05-25 DIAGNOSIS — I2510 Atherosclerotic heart disease of native coronary artery without angina pectoris: Secondary | ICD-10-CM | POA: Insufficient documentation

## 2020-05-25 DIAGNOSIS — R570 Cardiogenic shock: Secondary | ICD-10-CM | POA: Insufficient documentation

## 2020-05-25 DIAGNOSIS — I252 Old myocardial infarction: Secondary | ICD-10-CM | POA: Insufficient documentation

## 2020-05-25 DIAGNOSIS — Z79899 Other long term (current) drug therapy: Secondary | ICD-10-CM | POA: Insufficient documentation

## 2020-05-25 DIAGNOSIS — Z7902 Long term (current) use of antithrombotics/antiplatelets: Secondary | ICD-10-CM | POA: Insufficient documentation

## 2020-05-25 DIAGNOSIS — Z7982 Long term (current) use of aspirin: Secondary | ICD-10-CM | POA: Diagnosis not present

## 2020-05-25 DIAGNOSIS — F17210 Nicotine dependence, cigarettes, uncomplicated: Secondary | ICD-10-CM | POA: Diagnosis not present

## 2020-05-25 DIAGNOSIS — Z6838 Body mass index (BMI) 38.0-38.9, adult: Secondary | ICD-10-CM | POA: Insufficient documentation

## 2020-05-25 LAB — BASIC METABOLIC PANEL
Anion gap: 10 (ref 5–15)
BUN: 12 mg/dL (ref 6–20)
CO2: 20 mmol/L — ABNORMAL LOW (ref 22–32)
Calcium: 9.3 mg/dL (ref 8.9–10.3)
Chloride: 105 mmol/L (ref 98–111)
Creatinine, Ser: 1.08 mg/dL (ref 0.61–1.24)
GFR, Estimated: >60 mL/min (ref >60)
Glucose, Bld: 93 mg/dL (ref 70–99)
Potassium: 3.8 mmol/L (ref 3.5–5.1)
Sodium: 135 mmol/L (ref 135–145)

## 2020-05-25 LAB — CBC
HCT: 38.1 % — ABNORMAL LOW (ref 39.0–52.0)
Hemoglobin: 12.3 g/dL — ABNORMAL LOW (ref 13.0–17.0)
MCH: 30.7 pg (ref 26.0–34.0)
MCHC: 32.3 g/dL (ref 30.0–36.0)
MCV: 95 fL (ref 80.0–100.0)
Platelets: 423 10*3/uL — ABNORMAL HIGH (ref 150–400)
RBC: 4.01 MIL/uL — ABNORMAL LOW (ref 4.22–5.81)
RDW: 13.9 % (ref 11.5–15.5)
WBC: 7.3 10*3/uL (ref 4.0–10.5)
nRBC: 0 % (ref 0.0–0.2)

## 2020-05-25 NOTE — Patient Instructions (Signed)
Stop Amiodarone   Labs today We will only contact you if something comes back abnormal or we need to make some changes. Otherwise no news is good news!  Your physician recommends that you schedule a follow-up appointment in: 2 months with Dr Shirlee Latch  Do the following things EVERYDAY: 1) Weigh yourself in the morning before breakfast. Write it down and keep it in a log. 2) Take your medicines as prescribed 3) Eat low salt foods--Limit salt (sodium) to 2000 mg per day.  4) Stay as active as you can everyday 5) Limit all fluids for the day to less than 2 liters  At the [facility], you and your health needs are our priority. As part of our continuing mission to provide you with exceptional heart care, we have created designated Provider Care Teams. These Care Teams include your primary Cardiologist (physician) and Advanced Practice Providers (APPs- Physician Assistants and Nurse Practitioners) who all work together to provide you with the care you need, when you need it.   You may see any of the following providers on your designated Care Team at your next follow up: Marland Kitchen Dr Arvilla Meres . Dr Marca Ancona . Dr Thornell Mule . Tonye Becket, NP . Robbie Lis, PA . Shanda Bumps [place],NP . Karle Plumber, PharmD   Please be sure to bring in all your medications bottles to every appointment.   If you have any questions or concerns before your next appointment please send [place] a message through [place] or call our office at [phone].    TO LEAVE A MESSAGE FOR THE NURSE SELECT OPTION 2, PLEASE LEAVE A MESSAGE INCLUDING: . YOUR NAME . DATE OF BIRTH . CALL BACK NUMBER . REASON FOR CALL**this is important as we prioritize the call backs  YOU WILL RECEIVE A CALL BACK THE SAME DAY AS LONG AS YOU CALL BEFORE 4:00 PM

## 2020-06-02 ENCOUNTER — Other Ambulatory Visit (HOSPITAL_COMMUNITY): Payer: Self-pay | Admitting: *Deleted

## 2020-06-02 ENCOUNTER — Encounter (HOSPITAL_COMMUNITY): Payer: Self-pay

## 2020-06-02 MED ORDER — LOSARTAN POTASSIUM 25 MG PO TABS: 12.5000 mg | ORAL_TABLET | Freq: Two times a day (BID) | ORAL | 3 refills | Status: DC

## 2020-06-02 MED ORDER — CARVEDILOL 6.25 MG PO TABS: 6.2500 mg | ORAL_TABLET | Freq: Two times a day (BID) | ORAL | 3 refills | Status: DC

## 2020-06-02 MED ORDER — ATORVASTATIN CALCIUM 80 MG PO TABS: 80.0000 mg | ORAL_TABLET | Freq: Every day | ORAL | 3 refills | Status: DC

## 2020-06-03 ENCOUNTER — Telehealth: Payer: Self-pay | Admitting: Cardiology

## 2020-06-03 NOTE — Telephone Encounter (Signed)
Patient was calling to verify that Dr. Lalla Brothers received the paperwork for his Short Term Disability claim. The patient is not ready to go back to work yet and does not want to loose his benefits

## 2020-06-03 NOTE — Telephone Encounter (Signed)
Spoke with patient who states that he sent a pdf form via MyChart for his short term disability. I confirmed that PDF was received. He was recently seen in HF clinic which is where message was sent. Will route to them to follow up on.

## 2020-06-07 ENCOUNTER — Encounter (HOSPITAL_COMMUNITY): Payer: Self-pay

## 2020-06-08 ENCOUNTER — Other Ambulatory Visit (HOSPITAL_COMMUNITY): Payer: Self-pay | Admitting: *Deleted

## 2020-06-08 MED ORDER — LOSARTAN POTASSIUM 25 MG PO TABS: 12.5000 mg | ORAL_TABLET | Freq: Two times a day (BID) | ORAL | 3 refills | Status: DC

## 2020-06-08 MED ORDER — CARVEDILOL 6.25 MG PO TABS: 6.2500 mg | ORAL_TABLET | Freq: Two times a day (BID) | ORAL | 3 refills | Status: DC

## 2020-06-08 MED ORDER — DAPAGLIFLOZIN PROPANEDIOL 10 MG PO TABS: 10.0000 mg | ORAL_TABLET | Freq: Every day | ORAL | 5 refills | Status: DC

## 2020-06-08 MED ORDER — ATORVASTATIN CALCIUM 80 MG PO TABS: 80.0000 mg | ORAL_TABLET | Freq: Every day | ORAL | 3 refills | Status: DC

## 2020-06-08 MED ORDER — TICAGRELOR 90 MG PO TABS: 90.0000 mg | ORAL_TABLET | Freq: Two times a day (BID) | ORAL | 11 refills | Status: DC

## 2020-06-10 ENCOUNTER — Telehealth (HOSPITAL_COMMUNITY): Payer: Self-pay | Admitting: *Deleted

## 2020-06-10 NOTE — Telephone Encounter (Signed)
Victorino Dike from [employer] called requesting more information about why patient is being written out of work until August 1st and his last office visit said he is doing well. 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   Requests call back [phone] ext. [phone]  Routed to [facility]

## 2020-06-11 NOTE — Telephone Encounter (Signed)
Left message to call back  

## 2020-06-16 ENCOUNTER — Encounter (HOSPITAL_COMMUNITY): Payer: Self-pay

## 2020-06-23 ENCOUNTER — Encounter (HOSPITAL_COMMUNITY): Payer: Self-pay

## 2020-07-27 ENCOUNTER — Encounter (HOSPITAL_COMMUNITY): Payer: Self-pay | Admitting: Cardiology

## 2020-07-27 ENCOUNTER — Other Ambulatory Visit: Payer: Self-pay

## 2020-07-27 ENCOUNTER — Ambulatory Visit (HOSPITAL_COMMUNITY)
Admission: RE | Admit: 2020-07-27 | Discharge: 2020-07-27 | Disposition: A | Discharge status: Home / Self Care | Payer: BC Managed Care – PPO | Source: Ambulatory Visit | Attending: Cardiology | Admitting: Cardiology

## 2020-07-27 VITALS — BP 130/88 | HR 68 | Wt 210.0 lb

## 2020-07-27 DIAGNOSIS — I255 Ischemic cardiomyopathy: Secondary | ICD-10-CM | POA: Insufficient documentation

## 2020-07-27 DIAGNOSIS — Z79899 Other long term (current) drug therapy: Secondary | ICD-10-CM | POA: Insufficient documentation

## 2020-07-27 DIAGNOSIS — E669 Obesity, unspecified: Secondary | ICD-10-CM | POA: Insufficient documentation

## 2020-07-27 DIAGNOSIS — I5022 Chronic systolic (congestive) heart failure: Secondary | ICD-10-CM | POA: Diagnosis not present

## 2020-07-27 DIAGNOSIS — Z7902 Long term (current) use of antithrombotics/antiplatelets: Secondary | ICD-10-CM | POA: Insufficient documentation

## 2020-07-27 DIAGNOSIS — I2510 Atherosclerotic heart disease of native coronary artery without angina pectoris: Secondary | ICD-10-CM | POA: Diagnosis present

## 2020-07-27 DIAGNOSIS — Z7982 Long term (current) use of aspirin: Secondary | ICD-10-CM | POA: Diagnosis not present

## 2020-07-27 DIAGNOSIS — I110 Hypertensive heart disease with heart failure: Secondary | ICD-10-CM | POA: Insufficient documentation

## 2020-07-27 DIAGNOSIS — I252 Old myocardial infarction: Secondary | ICD-10-CM | POA: Insufficient documentation

## 2020-07-27 DIAGNOSIS — Z87891 Personal history of nicotine dependence: Secondary | ICD-10-CM | POA: Diagnosis not present

## 2020-07-27 DIAGNOSIS — Z8674 Personal history of sudden cardiac arrest: Secondary | ICD-10-CM | POA: Insufficient documentation

## 2020-07-27 HISTORY — DX: Post-traumatic stress disorder, unspecified: F43.10

## 2020-07-27 LAB — LIPID PANEL
Cholesterol: 170 mg/dL (ref 0–200)
HDL: 30 mg/dL — ABNORMAL LOW (ref >40)
LDL Cholesterol: 98 mg/dL (ref 0–99)
Total CHOL/HDL Ratio: 5.7 RATIO
Triglycerides: 210 mg/dL — ABNORMAL HIGH (ref <150)
VLDL: 42 mg/dL — ABNORMAL HIGH (ref 0–40)

## 2020-07-27 LAB — BASIC METABOLIC PANEL
Anion gap: 9 (ref 5–15)
BUN: 14 mg/dL (ref 6–20)
CO2: 23 mmol/L (ref 22–32)
Calcium: 9.9 mg/dL (ref 8.9–10.3)
Chloride: 105 mmol/L (ref 98–111)
Creatinine, Ser: 0.83 mg/dL (ref 0.61–1.24)
GFR, Estimated: >60 mL/min (ref >60)
Glucose, Bld: 83 mg/dL (ref 70–99)
Potassium: 4.2 mmol/L (ref 3.5–5.1)
Sodium: 137 mmol/L (ref 135–145)
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
## 2020-07-27 NOTE — Patient Instructions (Signed)
It was great to see you today! No medication changes are needed at this time.   Labs today We will only contact you if something comes back abnormal or we need to make some changes. Otherwise no news is good news!  Your physician recommends that you schedule a follow-up appointment in: 3 months with Dr Shirlee Latch and echo   Your physician has requested that you have an echocardiogram. Echocardiography is a painless test that uses sound waves to create images of your heart. It provides your doctor with information about the size and shape of your heart and how well your heart's chambers and valves are working. This procedure takes approximately one hour. There are no restrictions for this procedure.  Do the following things EVERYDAY: Weigh yourself in the morning before breakfast. Write it down and keep it in a log. Take your medicines as prescribed Eat low salt foods--Limit salt (sodium) to 2000 mg per day.  Stay as active as you can everyday Limit all fluids for the day to less than 2 liters  milAt the [facility], you and your health needs are our priority. As part of our continuing mission to provide you with exceptional heart care, we have created designated Provider Care Teams. These Care Teams include your primary Cardiologist (physician) and Advanced Practice Providers (APPs- Physician Assistants and Nurse Practitioners) who all work together to provide you with the care you need, when you need it.   You may see any of the following providers on your designated Care Team at your next follow up: Dr Arvilla Meres Dr Marca Ancona Dr Brandon Melnick, NP Robbie Lis, [place] Mikki Santee Karle Plumber, PharmD   Please be sure to bring in all your medications bottles to every appointment.

## 2020-08-02 ENCOUNTER — Encounter (HOSPITAL_COMMUNITY): Payer: Self-pay

## 2020-08-06 ENCOUNTER — Telehealth (HOSPITAL_COMMUNITY): Payer: Self-pay | Admitting: *Deleted

## 2020-08-06 DIAGNOSIS — E781 Pure hyperglyceridemia: Secondary | ICD-10-CM

## 2020-08-06 NOTE — Telephone Encounter (Signed)
Pt left VM asking if he is cleared to return to work next week.   Routed to Dr.McLean

## 2020-08-06 NOTE — Telephone Encounter (Signed)
May return to work  

## 2020-08-09 ENCOUNTER — Other Ambulatory Visit (HOSPITAL_COMMUNITY): Payer: Self-pay | Admitting: *Deleted

## 2020-08-09 ENCOUNTER — Encounter (HOSPITAL_COMMUNITY): Payer: Self-pay | Admitting: *Deleted

## 2020-08-09 MED ORDER — ICOSAPENT ETHYL 1 G PO CAPS: 2.0000 g | ORAL_CAPSULE | Freq: Two times a day (BID) | ORAL | 3 refills | Status: DC

## 2020-08-09 NOTE — Telephone Encounter (Signed)
Pt notified via mychart

## 2020-08-10 ENCOUNTER — Ambulatory Visit (INDEPENDENT_AMBULATORY_CARE_PROVIDER_SITE_OTHER): Payer: BC Managed Care – PPO

## 2020-08-10 DIAGNOSIS — I469 Cardiac arrest, cause unspecified: Secondary | ICD-10-CM | POA: Diagnosis not present

## 2020-08-10 LAB — CUP PACEART REMOTE DEVICE CHECK
Date Time Interrogation Session: 20220802091259
Implantable Lead Implant Date: 20220502
Implantable Lead Location: 753860
Implantable Lead Model: 436909
Implantable Lead Serial Number: 81384798
Implantable Pulse Generator Implant Date: 20220502
Pulse Gen Model: 429525
Pulse Gen Serial Number: 84818808

## 2020-08-12 NOTE — Addendum Note (Signed)
Addended by: Zella Richer L on: 08/12/2020 12:09 PM   Modules accepted: Orders

## 2020-08-17 ENCOUNTER — Other Ambulatory Visit: Payer: Self-pay

## 2020-08-17 ENCOUNTER — Ambulatory Visit (INDEPENDENT_AMBULATORY_CARE_PROVIDER_SITE_OTHER): Payer: BC Managed Care – PPO | Admitting: Cardiology

## 2020-08-17 ENCOUNTER — Encounter: Payer: Self-pay | Admitting: Cardiology

## 2020-08-17 ENCOUNTER — Telehealth (INDEPENDENT_AMBULATORY_CARE_PROVIDER_SITE_OTHER): Payer: BC Managed Care – PPO | Admitting: Pharmacist

## 2020-08-17 VITALS — BP 114/88 | HR 68 | Ht 70.0 in | Wt 212.3 lb

## 2020-08-17 DIAGNOSIS — E782 Mixed hyperlipidemia: Secondary | ICD-10-CM

## 2020-08-17 DIAGNOSIS — R7303 Prediabetes: Secondary | ICD-10-CM

## 2020-08-17 DIAGNOSIS — I5022 Chronic systolic (congestive) heart failure: Secondary | ICD-10-CM | POA: Diagnosis not present

## 2020-08-17 DIAGNOSIS — I213 ST elevation (STEMI) myocardial infarction of unspecified site: Secondary | ICD-10-CM

## 2020-08-17 DIAGNOSIS — E785 Hyperlipidemia, unspecified: Secondary | ICD-10-CM | POA: Insufficient documentation

## 2020-08-17 DIAGNOSIS — Z95810 Presence of automatic (implantable) cardiac defibrillator: Secondary | ICD-10-CM | POA: Diagnosis not present

## 2020-08-17 DIAGNOSIS — I469 Cardiac arrest, cause unspecified: Secondary | ICD-10-CM

## 2020-08-17 MED ORDER — ROSUVASTATIN CALCIUM 40 MG PO TABS: 40.0000 mg | ORAL_TABLET | Freq: Every day | ORAL | 3 refills | Status: DC

## 2020-08-17 NOTE — Progress Notes (Signed)
Electrophysiology Office Follow up Visit Note:    Date:  08/17/2020   ID:  Tyler Jacobson, DOB 05/22/1975, MRN [id number]  PCP:  Ernie Avena, PA-C  [facility] Electrophysiologist:  Lanier Prude, MD    Interval History:    Tyler Jacobson is a 45 y.o. male who presents for a follow up visit after his ICD implant on May 10, 2020.  He has a Biotronik device that is working well.  He has done well since his ICD implant.  No ICD therapies have been delivered.     Past Medical History:  Diagnosis Date   MI (myocardial infarction) (HCC)    PTSD (post-traumatic stress disorder)     Past Surgical History:  Procedure Laterality Date   CORONARY/GRAFT ACUTE MI REVASCULARIZATION N/A 05/01/2020   Procedure: Coronary/Graft Acute MI Revascularization;  Surgeon: Kathleene Hazel, MD;  Location: [facility] INVASIVE CV LAB;  Service: Cardiovascular;  Laterality: N/A;   ICD IMPLANT N/A 05/10/2020   Procedure: ICD IMPLANT;  Surgeon: Lanier Prude, MD;  Location: [facility] INVASIVE CV LAB;  Service: Cardiovascular;  Laterality: N/A;   LEFT HEART CATH AND CORONARY ANGIOGRAPHY N/A 05/01/2020   Procedure: LEFT HEART CATH AND CORONARY ANGIOGRAPHY;  Surgeon: Kathleene Hazel, MD;  Location: [facility] INVASIVE CV LAB;  Service: Cardiovascular;  Laterality: N/A;   VENTRICULAR ASSIST DEVICE INSERTION N/A 05/01/2020   Procedure: VENTRICULAR ASSIST DEVICE INSERTION;  Surgeon: Kathleene Hazel, MD;  Location: [facility] INVASIVE CV LAB;  Service: Cardiovascular;  Laterality: N/A;    Current Medications: Current Meds  Medication Sig   aspirin 81 MG EC tablet Take 1 tablet (81 mg total) by mouth daily. Swallow whole.   carvedilol (COREG) 6.25 MG tablet Take 1 tablet (6.25 mg total) by mouth 2 (two) times daily with a meal.   dapagliflozin propanediol (FARXIGA) 10 MG TABS tablet Take 1 tablet (10 mg total) by mouth daily.   icosapent Ethyl (VASCEPA) 1 g capsule Take 2 capsules (2 g total) by mouth 2 (two)  times daily.   losartan (COZAAR) 25 MG tablet Take 1/2 tablet (12.5 mg total) by mouth 2 (two) times daily.   rosuvastatin (CRESTOR) 40 MG tablet Take 1 tablet (40 mg total) by mouth daily.   ticagrelor (BRILINTA) 90 MG TABS tablet Take 1 tablet (90 mg total) by mouth 2 (two) times daily. Start Ticagrelor (Brilinta) night of 05/12/20     Allergies:   Demerol [meperidine hcl]   Social History   Socioeconomic History   Marital status: Divorced    Spouse name: Not on file   Number of children: Not on file   Years of education: Not on file   Highest education level: Not on file  Occupational History   Not on file  Tobacco Use   Smoking status: Former    Types: Cigarettes   Smokeless tobacco: Former  Substance and Sexual Activity   Alcohol use: Yes    Comment: "moderate drinker"   Drug use: Never   Sexual activity: Not on file  Other Topics Concern   Not on file  Social History Narrative   Not on file   Social Determinants of Health   Financial Resource Strain: Low Risk    Difficulty of Paying Living Expenses: Not very hard  Food Insecurity: No Food Insecurity   Worried About Running Out of Food in the Last Year: Never true   Ran Out of Food in the Last Year: Never true  Transportation Needs: No Transportation Needs  Lack of Transportation (Medical): No   Lack of Transportation (Non-Medical): No  Physical Activity: Not on file  Stress: Not on file  Social Connections: Not on file     Family History: The patient's Family history is unknown by patient.  ROS:   Please see the history of present illness.    All other systems reviewed and are negative.  EKGs/Labs/Other Studies Reviewed:    The following studies were reviewed today:  August 17, 2020 in clinic device interrogation personally reviewed Lead parameters are stable Decreased ventricular pulse amplitude to maximize battery longevity   EKG:  The ekg ordered today demonstrates sinus rhythm with inferior  infarction  Recent Labs: 05/07/2020: ALT 101; Magnesium 2.0 05/25/2020: Hemoglobin 12.3; Platelets 423 07/27/2020: BUN 14; Creatinine, Ser 0.83; Potassium 4.2; Sodium 137  Recent Lipid Panel    Component Value Date/Time   CHOL 170 07/27/2020 1552   TRIG 210 (H) 07/27/2020 1552   HDL 30 (L) 07/27/2020 1552   CHOLHDL 5.7 07/27/2020 1552   VLDL 42 (H) 07/27/2020 1552   LDLCALC 98 07/27/2020 1552   LDLDIRECT 106.8 (H) 05/01/2020 2104    Physical Exam:    VS:  BP 114/88   Pulse 68   Ht 5\' 10"  (1.778 m)   Wt 212 lb 4.8 oz (96.3 kg)   SpO2 98%   BMI 30.46 kg/m     Wt Readings from Last 3 Encounters:  08/17/20 212 lb 4.8 oz (96.3 kg)  07/27/20 210 lb (95.3 kg)  05/25/20 242 lb 9.6 oz (110 kg)     GEN:  Well nourished, well developed in no acute distress HEENT: Normal NECK: No JVD; No carotid bruits LYMPHATICS: No lymphadenopathy CARDIAC: RRR, no murmurs, rubs, gallops.  ICD pocket well-healed. RESPIRATORY:  Clear to auscultation without rales, wheezing or rhonchi  ABDOMEN: Soft, non-tender, non-distended MUSCULOSKELETAL:  No edema; No deformity  SKIN: Warm and dry NEUROLOGIC:  Alert and oriented x 3 PSYCHIATRIC:  Normal affect   ASSESSMENT:    1. Cardiac arrest (HCC)   2. ICD (implantable cardioverter-defibrillator) in place   3. Chronic systolic heart failure (HCC)   4. ST elevation myocardial infarction (STEMI), unspecified artery (HCC)    PLAN:    In order of problems listed above:   1. Cardiac arrest (HCC) 2. ICD (implantable cardioverter-defibrillator) in place  Doing well after ICD implant.  Device functioning well.  Continue remote monitoring.  I will plan to see him back in 9 months or sooner as needed.  3. Chronic systolic heart failure (HCC) NYHA class II.  Warm and dry on exam today.  Continue losartan, Farxiga, Coreg.  4.  Coronary artery disease Continue ticagrelor, aspirin, rosuvastatin.   Follow-up 9 months.    Medication Adjustments/Labs  and Tests Ordered: Current medicines are reviewed at length with the patient today.  Concerns regarding medicines are outlined above.  Orders Placed This Encounter  Procedures   EKG 12-Lead   No orders of the defined types were placed in this encounter.    Signed, 05/27/20, MD, [facility], [facility] 08/17/2020 4:50 PM    Electrophysiology [facility]

## 2020-08-17 NOTE — Patient Instructions (Addendum)
Medication Instructions:  Your physician recommends that you continue on your current medications as directed. Please refer to the Current Medication list given to you today. *If you need a refill on your cardiac medications before your next appointment, please call your pharmacy*  Lab Work: None ordered. If you have labs (blood work) drawn today and your tests are completely normal, you will receive your results only by: MyChart Message (if you have MyChart) OR A paper copy in the mail If you have any lab test that is abnormal or we need to change your treatment, we will call you to review the results.  Testing/Procedures: None ordered.  Follow-Up: At [facility], you and your health needs are our priority.  As part of our continuing mission to provide you with exceptional heart care, we have created designated Provider Care Teams.  These Care Teams include your primary Cardiologist (physician) and Advanced Practice Providers (APPs -  Physician Assistants and Nurse Practitioners) who all work together to provide you with the care you need, when you need it.  Your next appointment:   Your physician wants you to follow-up in: 9 months with Lanier Prude, MD. Bonita Quin will receive a reminder letter in the mail two months in advance. If you don't receive a letter, please call our office to schedule the follow-up appointment.  Remote monitoring is used to monitor your ICD from home. This monitoring reduces the number of office visits required to check your device to one time per year. It allows [place] to keep an eye on the functioning of your device to ensure it is working properly. You are scheduled for a device check from home on 11/09/2020. You may send your transmission at any time that day. If you have a wireless device, the transmission will be sent automatically. After your physician reviews your transmission, you will receive a postcard with your next transmission date.
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
## 2020-08-18 ENCOUNTER — Telehealth: Payer: Self-pay | Admitting: Pharmacist

## 2020-08-18 DIAGNOSIS — R7303 Prediabetes: Secondary | ICD-10-CM

## 2020-08-18 DIAGNOSIS — E782 Mixed hyperlipidemia: Secondary | ICD-10-CM

## 2020-08-18 MED ORDER — OZEMPIC (0.25 OR 0.5 MG/DOSE) 2 MG/1.5ML ~~LOC~~ SOPN: PEN_INJECTOR | SUBCUTANEOUS | 1 refills | Status: DC

## 2020-08-18 MED ORDER — PRALUENT 75 MG/ML ~~LOC~~ SOAJ: 1.0000 "pen " | SUBCUTANEOUS | 11 refills | Status: DC

## 2020-08-18 MED ORDER — PRALUENT 75 MG/ML ~~LOC~~ SOAJ
1.0000 | SUBCUTANEOUS | 11 refills | Status: DC
Start: 2020-08-18 — End: 2021-05-31

## 2020-08-18 NOTE — Telephone Encounter (Addendum)
Praluent prior authorization approved through 08/17/21. Reginal Lutes is an excluded med on his formulary, however Ozempic is available without prior authorization. Pt agreeable to start both meds. Ozempic $25/month but on 1-2 week delay, Praluent s apparently considered a specialty drug on his plan and needs to [facility], will resend in rx.  He'll continue on high intensity statin therapy and Vascepa for his lipids.  Will recheck advanced lipid panel, ApoB, Lp(a), and A1c in 3 months. Will also call pt monthly for his Ozempic dose titrations which he's starting to help with preDM, weight loss, and CV risk reduction.

## 2020-09-04 NOTE — Progress Notes (Signed)
Remote ICD transmission.   

## 2020-09-15 ENCOUNTER — Telehealth: Payer: Self-pay | Admitting: Pharmacist

## 2020-09-15 NOTE — Telephone Encounter (Signed)
Called pt to follow up with Ozempic tolerability. He is having some constipation but overall doing well. No real changes in appetite so far. He has just given his 4th dose of the 0.25mg . Advised him there is a refill on this rx and for month 2, he'll inject 0.5mg  weekly. I'll call pt in another month to follow up with tolerability and continued dose titration.

## 2020-09-27 ENCOUNTER — Telehealth: Payer: Self-pay | Admitting: Cardiology

## 2020-09-27 NOTE — Telephone Encounter (Signed)
Patient called and said his Daughter tested positive for COVID today (09/27/20). He currently is negative, but was not sure if there is anything he needs to be prepared for

## 2020-09-27 NOTE — Telephone Encounter (Signed)
Called patient back and encouraged him to call his PCP.

## 2020-09-28 ENCOUNTER — Telehealth: Payer: Self-pay | Admitting: Cardiology

## 2020-09-28 NOTE — Telephone Encounter (Signed)
If he is taking Paxlovid, he needs to stop ticagrelor while on it.  This is ok as he did not have a stent placed at the time of his MI.  Check with pharmacist about when he can restart.

## 2020-09-28 NOTE — Telephone Encounter (Signed)
[facility].  Will route to them to address.

## 2020-09-28 NOTE — Telephone Encounter (Signed)
Pt c/o medication issue:  1. Name of Medication: ticagrelor (BRILINTA) 90 MG TABS tablet  2. How are you currently taking this medication (dosage and times per day)? Take 1 tablet (90 mg total) by mouth 2 (two) times daily. Start Ticagrelor (Brilinta) night of 05/12/20  3. Are you having a reaction (difficulty breathing--STAT)? no  4. What is your medication issue? Pt is wanting to know if he is able to stop taking the Brilinta for 5 days while pt takes the Paxlovid please advise  .

## 2020-09-28 NOTE — Telephone Encounter (Signed)
.  please advise   What is your medication issue? Pt is wanting to know if he is able to stop taking the Brilinta for 5 days while pt takes the Paxlovid please advise

## 2020-09-29 ENCOUNTER — Telehealth (HOSPITAL_COMMUNITY): Payer: Self-pay | Admitting: Pharmacist

## 2020-09-29 NOTE — Telephone Encounter (Signed)
Received call from patient asking if he can take Paxlovid while on Brilinta. Informed him that per Dr. Alford Highland note on 09/28/20: "If he is taking Paxlovid, he needs to stop ticagrelor while on it.  This is ok as he did not have a stent placed at the time of his MI." Additionally informed the patient that he needs to wait 3 days after he finishes the Paxlovid to restart the Brilinta. Patient expressed understanding.    Karle Plumber, PharmD, BCPS, BCCP, CPP Heart Failure Clinic Pharmacist [phone]

## 2020-09-29 NOTE — Telephone Encounter (Signed)
Attempted to reach pt by phone. No answer/left vm requesting return call  

## 2020-09-29 NOTE — Telephone Encounter (Signed)
He can restart Brilinta 3 days after finishing the Paxlovid.

## 2020-10-13 ENCOUNTER — Telehealth: Payer: Self-pay | Admitting: Pharmacist

## 2020-10-13 NOTE — Telephone Encounter (Signed)
Called pt to follow up with Ozempic tolerability and titration. Still reports normal appetite, tolerating med well. Used 2 of the 0.5mg  doses from the first pen and 1 dose from his 2nd pen, still has 3 injections left from pen #2. Will send in refill and let pt know in another 3 weeks.

## 2020-10-14 ENCOUNTER — Other Ambulatory Visit: Payer: Self-pay | Admitting: Cardiology

## 2020-10-15 NOTE — Telephone Encounter (Signed)
Pt doesn't need refill, see phone note 10/5, has 3 more weekly doses then I'm calling pt to increase dose at that time

## 2020-10-29 ENCOUNTER — Encounter (HOSPITAL_COMMUNITY): Payer: Self-pay | Admitting: Cardiology

## 2020-10-29 ENCOUNTER — Other Ambulatory Visit: Payer: Self-pay

## 2020-10-29 ENCOUNTER — Other Ambulatory Visit: Payer: Self-pay | Admitting: Cardiology

## 2020-10-29 ENCOUNTER — Ambulatory Visit (HOSPITAL_COMMUNITY)
Admission: RE | Admit: 2020-10-29 | Discharge: 2020-10-29 | Disposition: A | Discharge status: Home / Self Care | Payer: BC Managed Care – PPO | Source: Ambulatory Visit | Attending: Cardiology | Admitting: Cardiology

## 2020-10-29 ENCOUNTER — Ambulatory Visit (HOSPITAL_BASED_OUTPATIENT_CLINIC_OR_DEPARTMENT_OTHER)
Admission: RE | Admit: 2020-10-29 | Discharge: 2020-10-29 | Disposition: A | Discharge status: Home / Self Care | Payer: BC Managed Care – PPO | Source: Ambulatory Visit | Attending: Cardiology | Admitting: Cardiology

## 2020-10-29 VITALS — BP 110/70 | HR 67 | Wt 203.0 lb

## 2020-10-29 DIAGNOSIS — I2582 Chronic total occlusion of coronary artery: Secondary | ICD-10-CM | POA: Insufficient documentation

## 2020-10-29 DIAGNOSIS — I071 Rheumatic tricuspid insufficiency: Secondary | ICD-10-CM | POA: Diagnosis not present

## 2020-10-29 DIAGNOSIS — I2510 Atherosclerotic heart disease of native coronary artery without angina pectoris: Secondary | ICD-10-CM | POA: Insufficient documentation

## 2020-10-29 DIAGNOSIS — Z79899 Other long term (current) drug therapy: Secondary | ICD-10-CM | POA: Insufficient documentation

## 2020-10-29 DIAGNOSIS — Z7984 Long term (current) use of oral hypoglycemic drugs: Secondary | ICD-10-CM | POA: Insufficient documentation

## 2020-10-29 DIAGNOSIS — I252 Old myocardial infarction: Secondary | ICD-10-CM | POA: Diagnosis not present

## 2020-10-29 DIAGNOSIS — Z95810 Presence of automatic (implantable) cardiac defibrillator: Secondary | ICD-10-CM | POA: Insufficient documentation

## 2020-10-29 DIAGNOSIS — I5022 Chronic systolic (congestive) heart failure: Secondary | ICD-10-CM

## 2020-10-29 DIAGNOSIS — I255 Ischemic cardiomyopathy: Secondary | ICD-10-CM | POA: Insufficient documentation

## 2020-10-29 DIAGNOSIS — I110 Hypertensive heart disease with heart failure: Secondary | ICD-10-CM | POA: Insufficient documentation

## 2020-10-29 DIAGNOSIS — Z7982 Long term (current) use of aspirin: Secondary | ICD-10-CM | POA: Diagnosis not present

## 2020-10-29 DIAGNOSIS — Z7902 Long term (current) use of antithrombotics/antiplatelets: Secondary | ICD-10-CM | POA: Insufficient documentation

## 2020-10-29 DIAGNOSIS — Z8674 Personal history of sudden cardiac arrest: Secondary | ICD-10-CM | POA: Insufficient documentation

## 2020-10-29 DIAGNOSIS — E785 Hyperlipidemia, unspecified: Secondary | ICD-10-CM | POA: Insufficient documentation

## 2020-10-29 DIAGNOSIS — Z7985 Long-term (current) use of injectable non-insulin antidiabetic drugs: Secondary | ICD-10-CM | POA: Insufficient documentation

## 2020-10-29 DIAGNOSIS — Z87891 Personal history of nicotine dependence: Secondary | ICD-10-CM | POA: Diagnosis not present

## 2020-10-29 LAB — BASIC METABOLIC PANEL
Anion gap: 7 (ref 5–15)
BUN: 12 mg/dL (ref 6–20)
CO2: 23 mmol/L (ref 22–32)
Calcium: 9.4 mg/dL (ref 8.9–10.3)
Chloride: 106 mmol/L (ref 98–111)
Creatinine, Ser: 0.92 mg/dL (ref 0.61–1.24)
GFR, Estimated: >60 mL/min (ref >60)
Glucose, Bld: 92 mg/dL (ref 70–99)
Potassium: 4.1 mmol/L (ref 3.5–5.1)
Sodium: 136 mmol/L (ref 135–145)

## 2020-10-29 LAB — ECHOCARDIOGRAM COMPLETE
Area-P 1/2: 2.79 cm2
S' Lateral: 2.7 cm

## 2020-10-29 NOTE — Patient Instructions (Addendum)
Labs done today. We will contact you only if your labs are abnormal.  No medication changes were made. Please continue all current medications as prescribed.  Your physician recommends that you schedule a follow-up appointment in: 6 months. Please contact our office in March 2023 to schedule a April 2023 appointment.   If you have any questions or concerns before your next appointment please send [place] a message through [place] or call our office at [phone].    TO LEAVE A MESSAGE FOR THE NURSE SELECT OPTION 2, PLEASE LEAVE A MESSAGE INCLUDING: YOUR NAME DATE OF BIRTH CALL BACK NUMBER REASON FOR CALL**this is important as we prioritize the call backs  YOU WILL RECEIVE A CALL BACK THE SAME DAY AS LONG AS YOU CALL BEFORE 4:00 PM   Do the following things EVERYDAY: Weigh yourself in the morning before breakfast. Write it down and keep it in a log. Take your medicines as prescribed Eat low salt foods--Limit salt (sodium) to 2000 mg per day.  Stay as active as you can everyday Limit all fluids for the day to less than 2 liters   At the [facility], you and your health needs are our priority. As part of our continuing mission to provide you with exceptional heart care, we have created designated Provider Care Teams. These Care Teams include your primary Cardiologist (physician) and Advanced Practice Providers (APPs- Physician Assistants and Nurse Practitioners) who all work together to provide you with the care you need, when you need it.   You may see any of the following providers on your designated Care Team at your next follow up: Dr Arvilla Meres Dr Carron Curie, NP Robbie Lis, [place] Karle Plumber, PharmD   Please be sure to bring in all your medications bottles to every appointment.
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
## 2020-11-03 ENCOUNTER — Telehealth: Payer: Self-pay | Admitting: Pharmacist

## 2020-11-03 MED ORDER — OZEMPIC (1 MG/DOSE) 4 MG/3ML ~~LOC~~ SOPN: 1.0000 mg | PEN_INJECTOR | SUBCUTANEOUS | 3 refills | Status: DC

## 2020-11-03 NOTE — Telephone Encounter (Signed)
Called pt and left message. Should be due for dose increase of Ozempic to 1mg  weekly, will need to ensure he's been tolerating the rest of his 0.5mg  dose first.

## 2020-11-03 NOTE — Telephone Encounter (Signed)
Pt returned call to clinic. Reports some constipation but overall tolerating Ozempic well. Has lost 7-8 lbs, has been dieting and working out more as well.  Unfortunately refill was sent in yesterday for 3 month supply of Ozempic 0.5mg  and pt is due for dose titration up to 1mg . He picked up the rx yesterday. He'll see if the pharmacy can process the 1mg  dose through his insurance and if so, he'll double up and give 2 0.5mg  injections each week of the 0.5mg  pen. If they can't, he'll need to continue on 0.5mg  for an extra 3 months before he can increase the dose.

## 2020-11-09 ENCOUNTER — Ambulatory Visit (INDEPENDENT_AMBULATORY_CARE_PROVIDER_SITE_OTHER): Payer: BC Managed Care – PPO

## 2020-11-09 ENCOUNTER — Other Ambulatory Visit: Payer: Self-pay

## 2020-11-09 DIAGNOSIS — I469 Cardiac arrest, cause unspecified: Secondary | ICD-10-CM | POA: Diagnosis not present

## 2020-11-09 LAB — CUP PACEART REMOTE DEVICE CHECK
Date Time Interrogation Session: 20221101162658
Implantable Lead Implant Date: 20220502
Implantable Lead Location: 753860
Implantable Lead Model: 436909
Implantable Lead Serial Number: 81384798
Implantable Pulse Generator Implant Date: 20220502
Pulse Gen Model: 429525
Pulse Gen Serial Number: 84818808

## 2020-11-11 ENCOUNTER — Other Ambulatory Visit (HOSPITAL_COMMUNITY): Payer: Self-pay

## 2020-11-16 NOTE — Progress Notes (Signed)
Remote ICD transmission.   

## 2020-11-25 ENCOUNTER — Other Ambulatory Visit: Payer: BC Managed Care – PPO

## 2020-11-25 ENCOUNTER — Other Ambulatory Visit: Payer: Self-pay

## 2020-11-25 DIAGNOSIS — E782 Mixed hyperlipidemia: Secondary | ICD-10-CM

## 2020-11-25 DIAGNOSIS — R7303 Prediabetes: Secondary | ICD-10-CM

## 2020-11-29 ENCOUNTER — Telehealth: Payer: Self-pay | Admitting: Pharmacist

## 2020-11-29 LAB — HEPATIC FUNCTION PANEL
ALT: 76 IU/L — ABNORMAL HIGH (ref 0–44)
AST: 36 IU/L (ref 0–40)
Albumin: 4.9 g/dL (ref 4.0–5.0)
Alkaline Phosphatase: 92 IU/L (ref 44–121)
Bilirubin Total: 0.5 mg/dL (ref 0.0–1.2)
Bilirubin, Direct: 0.19 mg/dL (ref 0.00–0.40)
Total Protein: 7 g/dL (ref 6.0–8.5)

## 2020-11-29 LAB — NMR, LIPOPROFILE
Cholesterol, Total: 66 mg/dL — ABNORMAL LOW (ref 100–199)
HDL Particle Number: 28.7 umol/L — ABNORMAL LOW (ref >=30.5)
HDL-C: 30 mg/dL — ABNORMAL LOW (ref >39)
LDL Particle Number: <300 nmol/L (ref <1000)
LDL-C (NIH Calc): 15 mg/dL (ref 0–99)
LP-IR Score: 67 — ABNORMAL HIGH (ref <=45)
Small LDL Particle Number: <90 nmol/L (ref <=527)
Triglycerides: 106 mg/dL (ref 0–149)

## 2020-11-29 LAB — HEMOGLOBIN A1C
Est. average glucose Bld gHb Est-mCnc: 111 mg/dL
Hgb A1c MFr Bld: 5.5 % (ref 4.8–5.6)

## 2020-11-29 LAB — LIPOPROTEIN A (LPA): Lipoprotein (a): 22.5 nmol/L (ref <75.0)

## 2020-11-29 LAB — APOLIPOPROTEIN B: Apolipoprotein B: 26 mg/dL (ref <90)

## 2020-11-29 NOTE — Telephone Encounter (Signed)
Excellent reduction in LDL from 98 to 15 after adding Praluent 75mg  Q2W to his rosuvastatin 40mg  daily, now at goal < 55. TG have also normalized and improved from 210 to 106 after adding Vascepa 2g BID. Lp(a) checked because of patient's premature ASCVD, this was normal. ApoB also normal. A1c has improved from 6.1 to 5.5 since starting Ozempic for weight loss, pre-DM, and CV risk reduction, he's no longer in the pre-diabetic range. ALT slightly elevated but this seems to be chronic. Called pt to advise him his labs are excellent and to continue on current therapy, left message.

## 2020-11-30 NOTE — Telephone Encounter (Signed)
Patient called back. I reviewed his lab results with him. All questions answered. He knows to continue Praluent 75mg  q 14 days, rosvuastatin 40mg  daily, Vascepa 2gBID and Ozempic.

## 2020-12-19 ENCOUNTER — Other Ambulatory Visit (HOSPITAL_COMMUNITY): Payer: Self-pay | Admitting: Cardiology

## 2021-02-08 ENCOUNTER — Ambulatory Visit (INDEPENDENT_AMBULATORY_CARE_PROVIDER_SITE_OTHER): Payer: BC Managed Care – PPO

## 2021-02-08 DIAGNOSIS — I469 Cardiac arrest, cause unspecified: Secondary | ICD-10-CM

## 2021-02-08 LAB — CUP PACEART REMOTE DEVICE CHECK
Date Time Interrogation Session: 20230131081832
Implantable Lead Implant Date: 20220502
Implantable Lead Location: 753860
Implantable Lead Model: 436909
Implantable Lead Serial Number: 81384798
Implantable Pulse Generator Implant Date: 20220502
Pulse Gen Model: 429525
Pulse Gen Serial Number: 84818808

## 2021-02-16 NOTE — Progress Notes (Signed)
Remote ICD transmission.   

## 2021-02-28 ENCOUNTER — Other Ambulatory Visit (HOSPITAL_COMMUNITY): Payer: Self-pay

## 2021-03-01 ENCOUNTER — Other Ambulatory Visit (HOSPITAL_COMMUNITY): Payer: Self-pay

## 2021-03-02 ENCOUNTER — Telehealth (HOSPITAL_COMMUNITY): Payer: Self-pay | Admitting: Pharmacy Technician

## 2021-03-02 NOTE — Telephone Encounter (Signed)
Advanced Heart Failure Patient Advocate Encounter  Prior Authorization for Tillman Sers has been submitted and approved.    Effective dates: 03/01/2021 through 02/29/2024  Archer Asa, CPhT

## 2021-03-30 ENCOUNTER — Other Ambulatory Visit: Payer: Self-pay | Admitting: Cardiology

## 2021-04-29 ENCOUNTER — Other Ambulatory Visit (HOSPITAL_COMMUNITY): Payer: Self-pay | Admitting: Cardiology

## 2021-05-02 ENCOUNTER — Other Ambulatory Visit (HOSPITAL_COMMUNITY): Payer: Self-pay | Admitting: Cardiology

## 2021-05-10 ENCOUNTER — Ambulatory Visit (INDEPENDENT_AMBULATORY_CARE_PROVIDER_SITE_OTHER): Payer: BC Managed Care – PPO

## 2021-05-10 DIAGNOSIS — I469 Cardiac arrest, cause unspecified: Secondary | ICD-10-CM

## 2021-05-10 LAB — CUP PACEART REMOTE DEVICE CHECK
Date Time Interrogation Session: 20230502102453
Implantable Lead Implant Date: 20220502
Implantable Lead Location: 753860
Implantable Lead Model: 436909
Implantable Lead Serial Number: 81384798
Implantable Pulse Generator Implant Date: 20220502
Pulse Gen Model: 429525
Pulse Gen Serial Number: 84818808

## 2021-05-16 ENCOUNTER — Encounter: Payer: Self-pay | Admitting: Pharmacist

## 2021-05-25 NOTE — Progress Notes (Signed)
Remote ICD transmission.   

## 2021-05-31 ENCOUNTER — Other Ambulatory Visit: Payer: Self-pay | Admitting: Cardiology

## 2021-06-23 ENCOUNTER — Encounter: Payer: Self-pay | Admitting: Cardiology

## 2021-06-23 ENCOUNTER — Telehealth: Payer: Self-pay

## 2021-06-23 ENCOUNTER — Ambulatory Visit (INDEPENDENT_AMBULATORY_CARE_PROVIDER_SITE_OTHER): Payer: BC Managed Care – PPO | Admitting: Cardiology

## 2021-06-23 VITALS — BP 118/82 | HR 77 | Ht 70.0 in | Wt 205.4 lb

## 2021-06-23 DIAGNOSIS — Z95810 Presence of automatic (implantable) cardiac defibrillator: Secondary | ICD-10-CM

## 2021-06-23 DIAGNOSIS — I469 Cardiac arrest, cause unspecified: Secondary | ICD-10-CM | POA: Diagnosis not present

## 2021-06-23 NOTE — Telephone Encounter (Signed)
LMOVM for the patient to check his monitor and to call Biotronik if he needs additional help.

## 2021-06-23 NOTE — Progress Notes (Addendum)
Electrophysiology Office Follow up Visit Note:    Date:  06/23/2021   ID:  Tyler Jacobson, DOB 1975/02/10, MRN [id number]  PCP:  Hayden Rasmussen, MD  [facility] Electrophysiologist:  Vickie Epley, MD    Interval History:    Tyler Jacobson is a 46 y.o. male who presents for a follow up visit. They were last seen in clinic on 08/17/2020. Had ICD implant on May 10, 2020.  He has a Biotronik device that is working well.  At his last visit he was doing well since his ICD implant.  No ICD therapies had been delivered.  Since their last appointment, they followed up with Dr. Aundra Dubin on 10/29/2020 where he was doing well.  Today, he is accompanied by a family member. Overall he appears well. He does complain of some chest discomfort that he attributes to his device. Mostly noticeable with movement and lying down at night. He denies any anginal symptoms.  Of note, he does believe any repeat lab work has been performed since last October. He would like to check his labs.  He denies any palpitations, shortness of breath, or peripheral edema. No lightheadedness, headaches, syncope, orthopnea, or PND.  He will follow up with Dr. Aundra Dubin next month.    Past Medical History:  Diagnosis Date   MI (myocardial infarction) ([place])    PTSD (post-traumatic stress disorder)     Past Surgical History:  Procedure Laterality Date   CORONARY/GRAFT ACUTE MI REVASCULARIZATION N/A 05/01/2020   Procedure: Coronary/Graft Acute MI Revascularization;  Surgeon: Burnell Blanks, MD;  Location: [place] [facility];  Service: Cardiovascular;  Laterality: N/A;   ICD IMPLANT N/A 05/10/2020   Procedure: ICD IMPLANT;  Surgeon: Vickie Epley, MD;  Location: [place] [facility];  Service: Cardiovascular;  Laterality: N/A;   LEFT HEART CATH AND CORONARY ANGIOGRAPHY N/A 05/01/2020   Procedure: LEFT HEART CATH AND CORONARY ANGIOGRAPHY;  Surgeon: Burnell Blanks, MD;  Location: [place] [facility];   Service: Cardiovascular;  Laterality: N/A;   VENTRICULAR ASSIST DEVICE INSERTION N/A 05/01/2020   Procedure: VENTRICULAR ASSIST DEVICE INSERTION;  Surgeon: Burnell Blanks, MD;  Location: [place] [facility];  Service: Cardiovascular;  Laterality: N/A;    Current Medications: Current Meds  Medication Sig   ASPIRIN LOW DOSE 81 MG EC tablet TAKE 1 TABLET BY MOUTH EVERY DAY SWALLOW WHOLE   carvedilol (COREG) 6.25 MG tablet Take 1 tablet (6.25 mg total) by mouth 2 (two) times daily with a meal.   FARXIGA 10 MG TABS tablet TAKE 1 TABLET BY MOUTH EVERY DAY   losartan (COZAAR) 25 MG tablet Take 1/2 tablet (12.5 mg total) by mouth 2 (two) times daily.   PRALUENT 75 MG/ML SOAJ INJECT 1 PEN INTO THE SKIN EVERY 14 (FOURTEEN) DAYS.   rosuvastatin (CRESTOR) 40 MG tablet TAKE 1 TABLET BY MOUTH EVERY DAY   Semaglutide, 1 MG/DOSE, (OZEMPIC, 1 MG/DOSE,) 4 MG/3ML SOPN INJECT 1MG INTO THE SKIN ONCE A WEEK   ticagrelor (BRILINTA) 90 MG TABS tablet Take 1 tablet (90 mg total) by mouth 2 (two) times daily. Start Ticagrelor (Brilinta) night of 05/12/20   VASCEPA 1 g capsule TAKE 2 CAPSULES BY MOUTH 2 TIMES DAILY.     Allergies:   Ceftin [cefuroxime], Demerol [meperidine hcl], and Versed [midazolam]   Social History   Socioeconomic History   Marital status: Divorced    Spouse name: Not on file   Number of children: Not on file   Years of  education: Not on file   Highest education level: Not on file  Occupational History   Not on file  Tobacco Use   Smoking status: Former    Types: Cigarettes   Smokeless tobacco: Former  Substance and Sexual Activity   Alcohol use: Yes    Comment: "moderate drinker"   Drug use: Never   Sexual activity: Not on file  Other Topics Concern   Not on file  Social History Narrative   Not on file   Social Determinants of Health   Financial Resource Strain: Low Risk  (05/06/2020)   Overall Financial Resource Strain (CARDIA)    Difficulty of Paying Living Expenses:  Not very hard  Food Insecurity: No Food Insecurity (05/06/2020)   Hunger Vital Sign    Worried About Running Out of Food in the Last Year: Never true    Ran Out of Food in the Last Year: Never true  Transportation Needs: No Transportation Needs (05/06/2020)   PRAPARE - Hydrologist (Medical): No    Lack of Transportation (Non-Medical): No  Physical Activity: Not on file  Stress: Not on file  Social Connections: Not on file     Family History: The patient's Family history is unknown by patient.  ROS:   Please see the history of present illness.    (+) Chest discomfort All other systems reviewed and are negative.  EKGs/Labs/Other Studies Reviewed:    The following studies were reviewed today:  06/23/2021 In clinic device interrogation personally reviewed: Battery longevity 100% Lead parameters stable No high-voltage therapies   August 17, 2020 in clinic device interrogation personally reviewed Lead parameters are stable Decreased ventricular pulse amplitude to maximize battery longevity   EKG:  EKG is personally reviewed. 06/23/2021: EKG today shows sinus rhythm with inferior and inferolateral Q waves consistent with prior infarction. 08/17/2020:  sinus rhythm with inferior infarction  Recent Labs: 10/29/2020: BUN 12; Creatinine, Ser 0.92; Potassium 4.1; Sodium 136 11/25/2020: ALT 76   Recent Lipid Panel    Component Value Date/Time   CHOL 170 07/27/2020 1552   TRIG 210 (H) 07/27/2020 1552   HDL 30 (L) 07/27/2020 1552   CHOLHDL 5.7 07/27/2020 1552   VLDL 42 (H) 07/27/2020 1552   LDLCALC 98 07/27/2020 1552   LDLDIRECT 106.8 (H) 05/01/2020 2104    Physical Exam:    VS:  BP 118/82   Pulse 77   Ht _0  (1.778 m)   Wt 205 lb 6.4 oz (93.2 kg)   SpO2 98%   BMI 29.47 kg/m     Wt Readings from Last 3 Encounters:  06/23/21 205 lb 6.4 oz (93.2 kg)  10/29/20 203 lb (92.1 kg)  08/17/20 212 lb 4.8 oz (96.3 kg)     GEN:  Well nourished,  well developed in no acute distress HEENT: Normal NECK: No JVD; No carotid bruits LYMPHATICS: No lymphadenopathy CARDIAC: RRR, no murmurs, rubs, gallops.  ICD pocket well-healed. RESPIRATORY:  Clear to auscultation without rales, wheezing or rhonchi  ABDOMEN: Soft, non-tender, non-distended MUSCULOSKELETAL:  No edema; No deformity  SKIN: Warm and dry NEUROLOGIC:  Alert and oriented x 3 PSYCHIATRIC:  Normal affect   ASSESSMENT:    1. Cardiac arrest ([place])   2. ICD (implantable cardioverter-defibrillator) in place     PLAN:    In order of problems listed above:  #Cardiac arrest #ICD in situ Doing well out of the hospital.  No recurrent episodes of ventricular malignant arrhythmias.  ICD functioning appropriately.  Continue remote monitoring.  He does mention some frustration about his device during today's visit.  There is some degree of anxiety surrounding the presence of the device and whether or not over shocked him.  We discussed this at length during the visit today.  I offered to refer him to Elias Else but he declined at this time.  Also discussed our ICD support group.  #Coronary artery disease No ischemic symptoms today    Follow-up in 1 year.  Medication Adjustments/Labs and Tests Ordered: Current medicines are reviewed at length with the patient today.  Concerns regarding medicines are outlined above.   Orders Placed This Encounter  Procedures   Comp Met (CMET)   CBC w/Diff   EKG 12-Lead   No orders of the defined types were placed in this encounter.   I,Mathew Stumpf,acting as a Education administrator for Vickie Epley, MD.,have documented all relevant documentation on the behalf of Vickie Epley, MD,as directed by  Vickie Epley, MD while in the presence of Vickie Epley, MD.  I, Vickie Epley, MD, have reviewed all documentation for this visit. The documentation on 06/23/21 for the exam, diagnosis, procedures, and orders are all accurate and  complete.   Signed, Lars Mage, MD, [facility], [facility] 06/23/2021 8:18 PM    Electrophysiology [facility]

## 2021-06-23 NOTE — Patient Instructions (Signed)
Medication Instructions:  Your physician recommends that you continue on your current medications as directed. Please refer to the Current Medication list given to you today.  Labwork: None ordered.  Testing/Procedures: None ordered.  Follow-Up: Your physician wants you to follow-up in: one year with Steffanie Dunn, MD or one of the following Advanced Practice Providers on your designated Care Team:   Francis Dowse, [place] Casimiro Needle "Mardelle Matte" Lanna Poche, [place]  Remote monitoring is used to monitor your ICD from home. This monitoring reduces the number of office visits required to check your device to one time per year. It allows [place] to keep an eye on the functioning of your device to ensure it is working properly. You are scheduled for a device check from home on 08/09/2021. You may send your transmission at any time that day. If you have a wireless device, the transmission will be sent automatically. After your physician reviews your transmission, you will receive a postcard with your next transmission date.  Any Other Special Instructions Will Be Listed Below (If Applicable).  If you need a refill on your cardiac medications before your next appointment, please call your pharmacy.   Important Information About Sugar

## 2021-06-24 LAB — COMPREHENSIVE METABOLIC PANEL
ALT: 58 IU/L — ABNORMAL HIGH (ref 0–44)
AST: 32 IU/L (ref 0–40)
Albumin/Globulin Ratio: 2.2 (ref 1.2–2.2)
Albumin: 5.1 g/dL — ABNORMAL HIGH (ref 4.0–5.0)
Alkaline Phosphatase: 79 IU/L (ref 44–121)
BUN/Creatinine Ratio: 20 (ref 9–20)
BUN: 19 mg/dL (ref 6–24)
Bilirubin Total: 0.6 mg/dL (ref 0.0–1.2)
CO2: 19 mmol/L — ABNORMAL LOW (ref 20–29)
Calcium: 10.2 mg/dL (ref 8.7–10.2)
Chloride: 107 mmol/L — ABNORMAL HIGH (ref 96–106)
Creatinine, Ser: 0.96 mg/dL (ref 0.76–1.27)
Globulin, Total: 2.3 g/dL (ref 1.5–4.5)
Glucose: 88 mg/dL (ref 70–99)
Potassium: 4.6 mmol/L (ref 3.5–5.2)
Sodium: 143 mmol/L (ref 134–144)
Total Protein: 7.4 g/dL (ref 6.0–8.5)
eGFR: 101 mL/min/{1.73_m2} (ref >59)

## 2021-06-24 LAB — CBC WITH DIFFERENTIAL/PLATELET
Basophils Absolute: 0.1 10*3/uL (ref 0.0–0.2)
Basos: 1 %
EOS (ABSOLUTE): 0.1 10*3/uL (ref 0.0–0.4)
Eos: 1 %
Hematocrit: 43.4 % (ref 37.5–51.0)
Hemoglobin: 14.9 g/dL (ref 13.0–17.7)
Immature Grans (Abs): 0 10*3/uL (ref 0.0–0.1)
Immature Granulocytes: 0 %
Lymphocytes Absolute: 2.7 10*3/uL (ref 0.7–3.1)
Lymphs: 34 %
MCH: 31.1 pg (ref 26.6–33.0)
MCHC: 34.3 g/dL (ref 31.5–35.7)
MCV: 91 fL (ref 79–97)
Monocytes Absolute: 0.7 10*3/uL (ref 0.1–0.9)
Monocytes: 9 %
Neutrophils Absolute: 4.5 10*3/uL (ref 1.4–7.0)
Neutrophils: 55 %
Platelets: 290 10*3/uL (ref 150–450)
RBC: 4.79 x10E6/uL (ref 4.14–5.80)
RDW: 12.9 % (ref 11.6–15.4)
WBC: 8.2 10*3/uL (ref 3.4–10.8)

## 2021-06-25 ENCOUNTER — Other Ambulatory Visit (HOSPITAL_COMMUNITY): Payer: Self-pay | Admitting: Cardiology

## 2021-07-02 ENCOUNTER — Other Ambulatory Visit (HOSPITAL_COMMUNITY): Payer: Self-pay | Admitting: Cardiology

## 2021-07-18 ENCOUNTER — Telehealth: Payer: Self-pay | Admitting: Pharmacist

## 2021-07-18 MED ORDER — REPATHA SURECLICK 140 MG/ML ~~LOC~~ SOAJ: 1.0000 | SUBCUTANEOUS | 3 refills | Status: DC

## 2021-07-18 NOTE — Telephone Encounter (Signed)
Pt's formulary changed from Praluent to Repatha. Repatha available on his plan, prior authorization approved through 07/19/22.         Left message for pt to make him aware of med change. Will need new rx sent to pharmacy once he is made aware of med change. He will need to call Repatha Ready at [phone] for copay card info, website froze on confirmation screen when I tried to activate a card for him twice.

## 2021-07-18 NOTE — Telephone Encounter (Signed)
Pt returned call and is aware of med change and to call for copay card.

## 2021-07-21 ENCOUNTER — Ambulatory Visit (HOSPITAL_COMMUNITY)
Admission: RE | Admit: 2021-07-21 | Discharge: 2021-07-21 | Disposition: A | Discharge status: Home / Self Care | Payer: BC Managed Care – PPO | Source: Ambulatory Visit | Attending: Cardiology | Admitting: Cardiology

## 2021-07-21 ENCOUNTER — Encounter (HOSPITAL_COMMUNITY): Payer: Self-pay | Admitting: Cardiology

## 2021-07-21 VITALS — BP 108/68 | HR 73 | Wt 206.0 lb

## 2021-07-21 DIAGNOSIS — Z7901 Long term (current) use of anticoagulants: Secondary | ICD-10-CM | POA: Diagnosis not present

## 2021-07-21 DIAGNOSIS — Z7984 Long term (current) use of oral hypoglycemic drugs: Secondary | ICD-10-CM | POA: Insufficient documentation

## 2021-07-21 DIAGNOSIS — I5022 Chronic systolic (congestive) heart failure: Secondary | ICD-10-CM | POA: Diagnosis not present

## 2021-07-21 DIAGNOSIS — Z7902 Long term (current) use of antithrombotics/antiplatelets: Secondary | ICD-10-CM | POA: Diagnosis not present

## 2021-07-21 DIAGNOSIS — I255 Ischemic cardiomyopathy: Secondary | ICD-10-CM | POA: Insufficient documentation

## 2021-07-21 DIAGNOSIS — E785 Hyperlipidemia, unspecified: Secondary | ICD-10-CM | POA: Diagnosis not present

## 2021-07-21 DIAGNOSIS — I110 Hypertensive heart disease with heart failure: Secondary | ICD-10-CM | POA: Diagnosis not present

## 2021-07-21 DIAGNOSIS — Z8679 Personal history of other diseases of the circulatory system: Secondary | ICD-10-CM | POA: Insufficient documentation

## 2021-07-21 DIAGNOSIS — N529 Male erectile dysfunction, unspecified: Secondary | ICD-10-CM | POA: Insufficient documentation

## 2021-07-21 DIAGNOSIS — I2510 Atherosclerotic heart disease of native coronary artery without angina pectoris: Secondary | ICD-10-CM | POA: Diagnosis not present

## 2021-07-21 DIAGNOSIS — Z79899 Other long term (current) drug therapy: Secondary | ICD-10-CM | POA: Diagnosis not present

## 2021-07-21 DIAGNOSIS — I252 Old myocardial infarction: Secondary | ICD-10-CM | POA: Diagnosis not present

## 2021-07-21 DIAGNOSIS — Z87891 Personal history of nicotine dependence: Secondary | ICD-10-CM | POA: Diagnosis not present

## 2021-07-21 DIAGNOSIS — Z7982 Long term (current) use of aspirin: Secondary | ICD-10-CM | POA: Insufficient documentation

## 2021-07-21 LAB — BASIC METABOLIC PANEL
Anion gap: 9 (ref 5–15)
BUN: 14 mg/dL (ref 6–20)
CO2: 22 mmol/L (ref 22–32)
Calcium: 9.7 mg/dL (ref 8.9–10.3)
Chloride: 109 mmol/L (ref 98–111)
Creatinine, Ser: 1.01 mg/dL (ref 0.61–1.24)
GFR, Estimated: >60 mL/min (ref >60)
Glucose, Bld: 100 mg/dL — ABNORMAL HIGH (ref 70–99)
Potassium: 4.2 mmol/L (ref 3.5–5.1)
Sodium: 140 mmol/L (ref 135–145)

## 2021-07-21 LAB — LIPID PANEL
Cholesterol: 49 mg/dL (ref 0–200)
HDL: 27 mg/dL — ABNORMAL LOW (ref >40)
LDL Cholesterol: 8 mg/dL (ref 0–99)
Total CHOL/HDL Ratio: 1.8 RATIO
Triglycerides: 69 mg/dL (ref <150)
VLDL: 14 mg/dL (ref 0–40)

## 2021-07-21 MED ORDER — CLOPIDOGREL BISULFATE 75 MG PO TABS: 75.0000 mg | ORAL_TABLET | Freq: Every day | ORAL | 3 refills | Status: DC

## 2021-07-21 MED ORDER — SILDENAFIL CITRATE 50 MG PO TABS: 50.0000 mg | ORAL_TABLET | Freq: Every day | ORAL | 3 refills | Status: AC | PRN

## 2021-07-21 NOTE — Patient Instructions (Signed)
Medication Changes:  STOP Aspirin  STOP Brilinta  START Plavix 75 mg Daily  Take Viagra 50 mg AS NEEDED  Lab Work:  Labs done today, your results will be available in MyChart, we will contact you for abnormal readings.  Testing/Procedures:  none  Referrals:  none  Special Instructions // Education:  Do the following things EVERYDAY: Weigh yourself in the morning before breakfast. Write it down and keep it in a log. Take your medicines as prescribed Eat low salt foods--Limit salt (sodium) to 2000 mg per day.  Stay as active as you can everyday Limit all fluids for the day to less than 2 liters   Follow-Up in: 6 months, **PLEASE CALL OUR OFFICE IN NOVEMBER TO SCHEDULE THIS APPOINTMENT  At the [facility], you and your health needs are our priority. We have a designated team specialized in the treatment of Heart Failure. This Care Team includes your primary Heart Failure Specialized Cardiologist (physician), Advanced Practice Providers (APPs- Physician Assistants and Nurse Practitioners), and Pharmacist who all work together to provide you with the care you need, when you need it.   You may see any of the following providers on your designated Care Team at your next follow up:  Dr Arvilla Meres Dr Carron Curie, NP Robbie Lis, [place] [facility] [place], [place] Karle Plumber, PharmD   Please be sure to bring in all your medications bottles to every appointment.   Need to Contact us:  If you have any questions or concerns before your next appointment please send [place] a message through [place] or call our office at [phone].    TO LEAVE A MESSAGE FOR THE NURSE SELECT OPTION 2, PLEASE LEAVE A MESSAGE INCLUDING: YOUR NAME DATE OF BIRTH CALL BACK NUMBER REASON FOR CALL**this is important as we prioritize the call backs  YOU WILL RECEIVE A CALL BACK THE SAME DAY AS LONG AS YOU CALL BEFORE 4:00 PM
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
## 2021-08-09 ENCOUNTER — Ambulatory Visit (INDEPENDENT_AMBULATORY_CARE_PROVIDER_SITE_OTHER): Payer: BC Managed Care – PPO

## 2021-08-09 DIAGNOSIS — Z95810 Presence of automatic (implantable) cardiac defibrillator: Secondary | ICD-10-CM | POA: Diagnosis not present

## 2021-08-09 DIAGNOSIS — I469 Cardiac arrest, cause unspecified: Secondary | ICD-10-CM

## 2021-08-09 LAB — CUP PACEART REMOTE DEVICE CHECK
Date Time Interrogation Session: 20230801134355
Implantable Lead Implant Date: 20220502
Implantable Lead Location: 753860
Implantable Lead Model: 436909
Implantable Lead Serial Number: 81384798
Implantable Pulse Generator Implant Date: 20220502
Pulse Gen Model: 429525
Pulse Gen Serial Number: 84818808

## 2021-09-07 NOTE — Progress Notes (Signed)
Remote ICD transmission.   

## 2021-10-18 ENCOUNTER — Other Ambulatory Visit (HOSPITAL_COMMUNITY): Payer: Self-pay | Admitting: Cardiology

## 2021-10-24 ENCOUNTER — Telehealth (HOSPITAL_COMMUNITY): Payer: Self-pay | Admitting: *Deleted

## 2021-10-24 NOTE — Telephone Encounter (Signed)
Labs faxed to rehab fax # [phone] attn: Erling Cruz

## 2021-11-01 ENCOUNTER — Other Ambulatory Visit (HOSPITAL_COMMUNITY): Payer: Self-pay | Admitting: Cardiology

## 2021-11-04 ENCOUNTER — Encounter: Payer: Self-pay | Admitting: Cardiology

## 2021-11-04 MED ORDER — SEMAGLUTIDE(0.25 OR 0.5MG/DOS) 2 MG/3ML ~~LOC~~ SOPN: PEN_INJECTOR | SUBCUTANEOUS | 0 refills | Status: DC

## 2021-11-08 ENCOUNTER — Ambulatory Visit (INDEPENDENT_AMBULATORY_CARE_PROVIDER_SITE_OTHER): Payer: BC Managed Care – PPO

## 2021-11-08 DIAGNOSIS — I469 Cardiac arrest, cause unspecified: Secondary | ICD-10-CM

## 2021-11-08 DIAGNOSIS — Z95810 Presence of automatic (implantable) cardiac defibrillator: Secondary | ICD-10-CM | POA: Diagnosis not present

## 2021-11-08 LAB — CUP PACEART REMOTE DEVICE CHECK
Date Time Interrogation Session: 20231031064357
Implantable Lead Connection Status: 753985
Implantable Lead Implant Date: 20220502
Implantable Lead Location: 753860
Implantable Lead Model: 436909
Implantable Lead Serial Number: 81384798
Implantable Pulse Generator Implant Date: 20220502
Pulse Gen Model: 429525
Pulse Gen Serial Number: 84818808

## 2021-11-23 NOTE — Progress Notes (Signed)
Remote ICD transmission.   

## 2021-12-16 ENCOUNTER — Other Ambulatory Visit: Payer: Self-pay | Admitting: Cardiology

## 2021-12-18 ENCOUNTER — Other Ambulatory Visit: Payer: Self-pay | Admitting: Cardiology

## 2021-12-19 ENCOUNTER — Other Ambulatory Visit: Payer: Self-pay | Admitting: Cardiology

## 2021-12-19 MED ORDER — SEMAGLUTIDE(0.25 OR 0.5MG/DOS) 2 MG/3ML ~~LOC~~ SOPN
PEN_INJECTOR | SUBCUTANEOUS | 1 refills | Status: DC
Start: 2021-12-19 — End: 2022-04-27

## 2022-02-03 ENCOUNTER — Encounter: Payer: Self-pay | Admitting: Cardiology

## 2022-02-07 ENCOUNTER — Ambulatory Visit: Payer: BC Managed Care – PPO

## 2022-02-07 DIAGNOSIS — I469 Cardiac arrest, cause unspecified: Secondary | ICD-10-CM

## 2022-02-07 LAB — CUP PACEART REMOTE DEVICE CHECK
Date Time Interrogation Session: 20240130074522
Implantable Lead Connection Status: 753985
Implantable Lead Implant Date: 20220502
Implantable Lead Location: 753860
Implantable Lead Model: 436909
Implantable Lead Serial Number: 81384798
Implantable Pulse Generator Implant Date: 20220502
Pulse Gen Model: 429525
Pulse Gen Serial Number: 84818808

## 2022-03-06 NOTE — Progress Notes (Signed)
Remote ICD transmission.   

## 2022-03-13 ENCOUNTER — Other Ambulatory Visit (HOSPITAL_COMMUNITY): Payer: Self-pay | Admitting: Cardiology

## 2022-04-10 ENCOUNTER — Other Ambulatory Visit: Payer: Self-pay | Admitting: Cardiology

## 2022-04-21 ENCOUNTER — Other Ambulatory Visit: Payer: Self-pay | Admitting: Cardiology

## 2022-04-23 ENCOUNTER — Other Ambulatory Visit (HOSPITAL_COMMUNITY): Payer: Self-pay | Admitting: Cardiology

## 2022-04-27 ENCOUNTER — Other Ambulatory Visit: Payer: Self-pay | Admitting: Cardiology

## 2022-05-09 ENCOUNTER — Ambulatory Visit (INDEPENDENT_AMBULATORY_CARE_PROVIDER_SITE_OTHER): Payer: BC Managed Care – PPO

## 2022-05-09 DIAGNOSIS — I469 Cardiac arrest, cause unspecified: Secondary | ICD-10-CM | POA: Diagnosis not present

## 2022-05-10 LAB — CUP PACEART REMOTE DEVICE CHECK
Date Time Interrogation Session: 20240430072425
Implantable Lead Connection Status: 753985
Implantable Lead Implant Date: 20220502
Implantable Lead Location: 753860
Implantable Lead Model: 436909
Implantable Lead Serial Number: 81384798
Implantable Pulse Generator Implant Date: 20220502
Pulse Gen Model: 429525
Pulse Gen Serial Number: 84818808

## 2022-05-11 ENCOUNTER — Encounter (HOSPITAL_COMMUNITY): Payer: Self-pay | Admitting: Cardiology

## 2022-05-11 ENCOUNTER — Ambulatory Visit (HOSPITAL_COMMUNITY)
Admission: RE | Admit: 2022-05-11 | Discharge: 2022-05-11 | Disposition: A | Discharge status: Home / Self Care | Payer: BC Managed Care – PPO | Source: Ambulatory Visit | Attending: Cardiology | Admitting: Cardiology

## 2022-05-11 VITALS — BP 130/88 | HR 78 | Wt 223.2 lb

## 2022-05-11 DIAGNOSIS — I110 Hypertensive heart disease with heart failure: Secondary | ICD-10-CM | POA: Insufficient documentation

## 2022-05-11 DIAGNOSIS — Z8674 Personal history of sudden cardiac arrest: Secondary | ICD-10-CM | POA: Insufficient documentation

## 2022-05-11 DIAGNOSIS — Z87891 Personal history of nicotine dependence: Secondary | ICD-10-CM | POA: Diagnosis not present

## 2022-05-11 DIAGNOSIS — R9431 Abnormal electrocardiogram [ECG] [EKG]: Secondary | ICD-10-CM | POA: Insufficient documentation

## 2022-05-11 DIAGNOSIS — Z79899 Other long term (current) drug therapy: Secondary | ICD-10-CM | POA: Diagnosis not present

## 2022-05-11 DIAGNOSIS — Z7902 Long term (current) use of antithrombotics/antiplatelets: Secondary | ICD-10-CM | POA: Diagnosis not present

## 2022-05-11 DIAGNOSIS — I252 Old myocardial infarction: Secondary | ICD-10-CM | POA: Diagnosis not present

## 2022-05-11 DIAGNOSIS — Z7984 Long term (current) use of oral hypoglycemic drugs: Secondary | ICD-10-CM | POA: Diagnosis not present

## 2022-05-11 DIAGNOSIS — I255 Ischemic cardiomyopathy: Secondary | ICD-10-CM | POA: Diagnosis not present

## 2022-05-11 DIAGNOSIS — R079 Chest pain, unspecified: Secondary | ICD-10-CM | POA: Diagnosis not present

## 2022-05-11 DIAGNOSIS — E785 Hyperlipidemia, unspecified: Secondary | ICD-10-CM | POA: Diagnosis not present

## 2022-05-11 DIAGNOSIS — Z95810 Presence of automatic (implantable) cardiac defibrillator: Secondary | ICD-10-CM | POA: Insufficient documentation

## 2022-05-11 DIAGNOSIS — I5022 Chronic systolic (congestive) heart failure: Secondary | ICD-10-CM | POA: Diagnosis present

## 2022-05-11 DIAGNOSIS — I2510 Atherosclerotic heart disease of native coronary artery without angina pectoris: Secondary | ICD-10-CM | POA: Insufficient documentation

## 2022-05-11 LAB — BASIC METABOLIC PANEL
Anion gap: 12 (ref 5–15)
BUN: 17 mg/dL (ref 6–20)
CO2: 22 mmol/L (ref 22–32)
Calcium: 9.5 mg/dL (ref 8.9–10.3)
Chloride: 104 mmol/L (ref 98–111)
Creatinine, Ser: 1.04 mg/dL (ref 0.61–1.24)
GFR, Estimated: >60 mL/min (ref >60)
Glucose, Bld: 88 mg/dL (ref 70–99)
Potassium: 4.3 mmol/L (ref 3.5–5.1)
Sodium: 138 mmol/L (ref 135–145)

## 2022-05-11 LAB — LIPID PANEL
Cholesterol: 54 mg/dL (ref 0–200)
HDL: 28 mg/dL — ABNORMAL LOW (ref >40)
Total CHOL/HDL Ratio: 1.9 RATIO
Triglycerides: 150 mg/dL — ABNORMAL HIGH (ref <150)
VLDL: 30 mg/dL (ref 0–40)
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
## 2022-05-11 NOTE — Patient Instructions (Addendum)
No medication changes today  Lab Work:  Labs done today, your results will be available in MyChart, we will contact you for abnormal readings.  Your physician has requested that you have an echocardiogram. Echocardiography is a painless test that uses sound waves to create images of your heart. It provides your doctor with information about the size and shape of your heart and how well your heart's chambers and valves are working. This procedure takes approximately one hour. There are no restrictions for this procedure. Please do NOT wear cologne, perfume, aftershave, or lotions (deodorant is allowed). Please arrive 15 minutes prior to your appointment time.   Follow-Up in:   Your physician recommends that you schedule a follow-up appointment in: 1 year    Do the following things EVERYDAY: Weigh yourself in the morning before breakfast. Write it down and keep it in a log. Take your medicines as prescribed Eat low salt foods--Limit salt (sodium) to 2000 mg per day.  Stay as active as you can everyday Limit all fluids for the day to less than 2 liters    Need to Contact us:  If you have any questions or concerns before your next appointment please send [place] a message through [place] or call our office at [phone].    TO LEAVE A MESSAGE FOR THE NURSE SELECT OPTION 2, PLEASE LEAVE A MESSAGE INCLUDING: YOUR NAME DATE OF BIRTH CALL BACK NUMBER REASON FOR CALL**this is important as we prioritize the call backs  YOU WILL RECEIVE A CALL BACK THE SAME DAY AS LONG AS YOU CALL BEFORE 4:00 PM   At the [facility], you and your health needs are our priority. As part of our continuing mission to provide you with exceptional heart care, we have created designated Provider Care Teams. These Care Teams include your primary Cardiologist (physician) and Advanced Practice Providers (APPs- Physician Assistants and Nurse Practitioners) who all work together to provide you with the  care you need, when you need it.   You may see any of the following providers on your designated Care Team at your next follow up: Dr Arvilla Meres Dr Marca Ancona Dr. Marcos Eke, NP Robbie Lis, [place] [facility], [place] Brynda Peon, NP Karle Plumber, PharmD   Please be sure to bring in all your medications bottles to every appointment.    Thank you for choosing [facility]

## 2022-05-15 ENCOUNTER — Other Ambulatory Visit (HOSPITAL_COMMUNITY): Payer: Self-pay

## 2022-05-15 DIAGNOSIS — E785 Hyperlipidemia, unspecified: Secondary | ICD-10-CM

## 2022-05-22 ENCOUNTER — Encounter (HOSPITAL_COMMUNITY): Payer: Self-pay

## 2022-05-23 ENCOUNTER — Telehealth (HOSPITAL_COMMUNITY): Payer: Self-pay | Admitting: Pharmacy Technician

## 2022-05-23 ENCOUNTER — Other Ambulatory Visit (HOSPITAL_COMMUNITY): Payer: Self-pay

## 2022-05-23 NOTE — Telephone Encounter (Signed)
Advanced [facility] Failure Patient Advocate Encounter  Patient reached out about Vascepa not being covered by insurance. Ran test claim, insurance states that refills are not covered. Patient needs to call number on back of card. Tried a few medications to confirm whether or not it was drug specific. From what I can tell, its any refill.   Attempted to call patient. Followed up with mychart message.  Archer Asa, CPhT

## 2022-05-31 NOTE — Progress Notes (Signed)
Remote ICD transmission.   

## 2022-06-08 ENCOUNTER — Ambulatory Visit (HOSPITAL_COMMUNITY): Payer: BC Managed Care – PPO

## 2022-06-08 ENCOUNTER — Ambulatory Visit (HOSPITAL_COMMUNITY)
Admission: RE | Admit: 2022-06-08 | Discharge: 2022-06-08 | Disposition: A | Discharge status: Home / Self Care | Payer: BC Managed Care – PPO | Source: Ambulatory Visit | Attending: Internal Medicine | Admitting: Internal Medicine

## 2022-06-08 DIAGNOSIS — E785 Hyperlipidemia, unspecified: Secondary | ICD-10-CM | POA: Diagnosis present

## 2022-06-08 LAB — LIPID PANEL
Cholesterol: 59 mg/dL (ref 0–200)
HDL: 35 mg/dL — ABNORMAL LOW (ref >40)
LDL Cholesterol: 1 mg/dL (ref 0–99)
Total CHOL/HDL Ratio: 1.7 RATIO
Triglycerides: 114 mg/dL (ref <150)
VLDL: 23 mg/dL (ref 0–40)

## 2022-06-09 ENCOUNTER — Telehealth (HOSPITAL_COMMUNITY): Payer: Self-pay

## 2022-06-09 DIAGNOSIS — E785 Hyperlipidemia, unspecified: Secondary | ICD-10-CM

## 2022-06-09 MED ORDER — ROSUVASTATIN CALCIUM 20 MG PO TABS: 20.0000 mg | ORAL_TABLET | Freq: Every day | ORAL | 3 refills | Status: DC

## 2022-06-09 NOTE — Telephone Encounter (Signed)
-----   Message from Laurey Morale, MD sent at 06/08/2022 12:17 PM EDT ----- LDL is very low at 1. Can decrease Crestor to 20 mg daily, lipids 2 months.

## 2022-06-09 NOTE — Telephone Encounter (Signed)
Patient advised and verbalized understanding,lab appointment scheduled,lab orders entered. Med list updated to reflect changes.  Orders Placed This Encounter  Procedures   Lipid Profile    Standing Status:   Future    Standing Expiration Date:   06/09/2023    Order Specific Question:   Release to patient    Answer:   Immediate [1]   Meds ordered this encounter  Medications   rosuvastatin (CRESTOR) 20 MG tablet    Sig: Take 1 tablet (20 mg total) by mouth daily.    Dispense:  90 tablet    Refill:  3    Please cancel all previous orders for current medication. Change in dosage or pill size.

## 2022-06-10 ENCOUNTER — Other Ambulatory Visit (HOSPITAL_COMMUNITY): Payer: Self-pay | Admitting: Cardiology

## 2022-06-18 ENCOUNTER — Other Ambulatory Visit (HOSPITAL_COMMUNITY): Payer: Self-pay | Admitting: Cardiology

## 2022-06-20 NOTE — Progress Notes (Unsigned)
  Electrophysiology Office Note:   Date:  06/22/2022  ID:  Odarius Dines, DOB August 15, 1975, MRN [id number]  Primary Cardiologist: None Electrophysiologist: Lanier Prude, MD      History of Present Illness:   Tyler Jacobson is a 47 y.o. male with h/o ICM, CAD, and OOH VT/VF arrest seen today for routine electrophysiology followup.    Since last being seen in our clinic the patient reports doing well from a cardiac perspective. Has on-going frustration for the co-pays and deductibles associated with his health issues. Is aware of his device when working out and swimming.   he denies chest pain, palpitations, dyspnea, PND, orthopnea, nausea, vomiting, dizziness, syncope, edema, weight gain, or early satiety.   Review of systems complete and found to be negative unless listed in HPI.   Device History: Biotronik Single Chamber ICD implanted 05/2020 for cardiac arrest   Studies Reviewed:    ICD Interrogation-  reviewed in detail today,  See PACEART report.  EKG is not ordered today. EKG from 05/11/2022 reviewed which showed NSR at 74 bpm   Physical Exam:   VS:  BP 114/82   Pulse 70   Ht 5\' 10"  (1.778 m)   Wt 222 lb (100.7 kg)   SpO2 98%   BMI 31.85 kg/m    Wt Readings from Last 3 Encounters:  06/22/22 222 lb (100.7 kg)  05/11/22 223 lb 3.2 oz (101.2 kg)  07/21/21 206 lb (93.4 kg)     GEN: Well nourished, well developed in no acute distress NECK: No JVD; No carotid bruits CARDIAC: Regular rate and rhythm, no murmurs, rubs, gallops RESPIRATORY:  Clear to auscultation without rales, wheezing or rhonchi  ABDOMEN: Soft, non-tender, non-distended EXTREMITIES:  No edema; No deformity   ASSESSMENT AND PLAN:    Cardiac arrest s/p Biotronik single chamber ICD  euvolemic today Stable on an appropriate medical regimen Normal ICD function See Pace Art report No changes today EF 10/2020 LVEF 60-65%.  Pending update, but currently would be >$1000 out of pocket.   CAD Denies  s/s ischemia  Disposition:   Follow up with Dr. Lalla Brothers in 12 months  Signed, Graciella Freer, PA-C

## 2022-06-22 ENCOUNTER — Ambulatory Visit: Payer: BC Managed Care – PPO | Attending: Student | Admitting: Student

## 2022-06-22 ENCOUNTER — Encounter: Payer: Self-pay | Admitting: Student

## 2022-06-22 VITALS — BP 114/82 | HR 70 | Ht 70.0 in | Wt 222.0 lb

## 2022-06-22 DIAGNOSIS — I2510 Atherosclerotic heart disease of native coronary artery without angina pectoris: Secondary | ICD-10-CM

## 2022-06-22 DIAGNOSIS — I469 Cardiac arrest, cause unspecified: Secondary | ICD-10-CM

## 2022-06-22 LAB — CUP PACEART INCLINIC DEVICE CHECK
Date Time Interrogation Session: 20240613100810
Implantable Lead Connection Status: 753985
Implantable Lead Implant Date: 20220502
Implantable Lead Location: 753860
Implantable Lead Model: 436909
Implantable Lead Serial Number: 81384798
Implantable Pulse Generator Implant Date: 20220502
Pulse Gen Model: 429525
Pulse Gen Serial Number: 84818808

## 2022-06-22 NOTE — Patient Instructions (Signed)
Medication Instructions:  Your physician recommends that you continue on your current medications as directed. Please refer to the Current Medication list given to you today.  *If you need a refill on your cardiac medications before your next appointment, please call your pharmacy*   Lab Work: None ordered If you have labs (blood work) drawn today and your tests are completely normal, you will receive your results only by: MyChart Message (if you have MyChart) OR A paper copy in the mail If you have any lab test that is abnormal or we need to change your treatment, we will call you to review the results   Follow-Up: At  [facility], you and your health needs are our priority.  As part of our continuing mission to provide you with exceptional heart care, we have created designated Provider Care Teams.  These Care Teams include your primary Cardiologist (physician) and Advanced Practice Providers (APPs -  Physician Assistants and Nurse Practitioners) who all work together to provide you with the care you need, when you need it.  Your next appointment:   1 year(s)  Provider:   Cameron Lambert, MD  

## 2022-07-08 ENCOUNTER — Other Ambulatory Visit (HOSPITAL_COMMUNITY): Payer: Self-pay | Admitting: Cardiology

## 2022-07-18 ENCOUNTER — Other Ambulatory Visit: Payer: Self-pay | Admitting: Cardiology

## 2022-07-30 ENCOUNTER — Other Ambulatory Visit: Payer: Self-pay | Admitting: Cardiology

## 2022-08-08 ENCOUNTER — Ambulatory Visit: Payer: BC Managed Care – PPO

## 2022-08-08 DIAGNOSIS — I469 Cardiac arrest, cause unspecified: Secondary | ICD-10-CM

## 2022-08-08 LAB — CUP PACEART REMOTE DEVICE CHECK
Date Time Interrogation Session: 20240730084529
Implantable Lead Connection Status: 753985
Implantable Lead Implant Date: 20220502
Implantable Lead Location: 753860
Implantable Lead Model: 436909
Implantable Lead Serial Number: 81384798
Implantable Pulse Generator Implant Date: 20220502
Pulse Gen Model: 429525
Pulse Gen Serial Number: 84818808

## 2022-08-28 ENCOUNTER — Other Ambulatory Visit (HOSPITAL_COMMUNITY): Payer: Self-pay | Admitting: Cardiology

## 2022-08-30 NOTE — Progress Notes (Signed)
Remote ICD transmission.   

## 2022-08-31 ENCOUNTER — Ambulatory Visit (HOSPITAL_COMMUNITY)
Admission: RE | Admit: 2022-08-31 | Discharge: 2022-08-31 | Disposition: A | Discharge status: Home / Self Care | Payer: BC Managed Care – PPO | Source: Ambulatory Visit | Attending: Internal Medicine | Admitting: Internal Medicine

## 2022-08-31 DIAGNOSIS — E785 Hyperlipidemia, unspecified: Secondary | ICD-10-CM | POA: Insufficient documentation

## 2022-08-31 LAB — LIPID PANEL
Cholesterol: 58 mg/dL (ref 0–200)
HDL: 28 mg/dL — ABNORMAL LOW (ref >40)
LDL Cholesterol: 7 mg/dL (ref 0–99)
Total CHOL/HDL Ratio: 2.1 RATIO
Triglycerides: 114 mg/dL (ref <150)
VLDL: 23 mg/dL (ref 0–40)

## 2022-09-24 ENCOUNTER — Other Ambulatory Visit: Payer: Self-pay | Admitting: Cardiology

## 2022-10-10 ENCOUNTER — Other Ambulatory Visit (HOSPITAL_COMMUNITY): Payer: Self-pay | Admitting: Cardiology

## 2022-10-11 MED ORDER — DAPAGLIFLOZIN PROPANEDIOL 10 MG PO TABS: 10.0000 mg | ORAL_TABLET | Freq: Every day | ORAL | 2 refills | Status: DC

## 2022-11-07 ENCOUNTER — Ambulatory Visit (INDEPENDENT_AMBULATORY_CARE_PROVIDER_SITE_OTHER): Payer: BC Managed Care – PPO

## 2022-11-07 DIAGNOSIS — I469 Cardiac arrest, cause unspecified: Secondary | ICD-10-CM

## 2022-11-08 LAB — CUP PACEART REMOTE DEVICE CHECK
Battery Voltage: 3.11 V
Brady Statistic RV Percent Paced: 0 %
Date Time Interrogation Session: 20241029072721
HighPow Impedance: 83 Ohm
Implantable Lead Connection Status: 753985
Implantable Lead Implant Date: 20220502
Implantable Lead Location: 753860
Implantable Lead Model: 436909
Implantable Lead Serial Number: 81384798
Implantable Pulse Generator Implant Date: 20220502
Lead Channel Impedance Value: 638 Ohm
Lead Channel Pacing Threshold Amplitude: 0.4 V
Lead Channel Pacing Threshold Pulse Width: 0.4 ms
Lead Channel Sensing Intrinsic Amplitude: 16.1 mV
Lead Channel Sensing Intrinsic Amplitude: 8 mV
Lead Channel Setting Pacing Amplitude: 2 V
Lead Channel Setting Pacing Pulse Width: 0.4 ms
Lead Channel Setting Sensing Sensitivity: 0.8 mV
Pulse Gen Model: 429525
Pulse Gen Serial Number: 84818808

## 2022-11-27 NOTE — Progress Notes (Signed)
Remote ICD transmission.   

## 2022-12-13 ENCOUNTER — Encounter: Payer: Self-pay | Admitting: Cardiology

## 2022-12-15 ENCOUNTER — Telehealth: Payer: Self-pay

## 2022-12-15 ENCOUNTER — Telehealth: Payer: Self-pay | Admitting: Pharmacist

## 2022-12-15 ENCOUNTER — Other Ambulatory Visit (HOSPITAL_COMMUNITY): Payer: Self-pay

## 2022-12-15 NOTE — Telephone Encounter (Signed)
Pharmacy Patient Advocate Encounter After speaking with patient/insurance, clarified he is needing a PA renewal for next year, but this must be submitted through an outside source (not CMM). PA request submitted through [email or web address]   Received notification from Physician's Office that prior authorization for [facility] is required/requested.   Insurance verification completed.   The patient is insured through  [employer]  .   Per test claim: PA required; PA submitted to above mentioned insurance via [email or web address] Key/confirmation #/EOC [id number] Status is pending

## 2022-12-15 NOTE — Telephone Encounter (Addendum)
Per test claim, no PA is required.    Per test claim: The current 28 day co-pay is, $10.  No PA needed at this time. This test claim was processed through [facility]- copay amounts may vary at other pharmacies due to pharmacy/plan contracts, or as the patient moves through the different stages of their insurance plan.

## 2022-12-15 NOTE — Telephone Encounter (Signed)
Please complete PA for Ozempic (please see mychart message)

## 2022-12-18 ENCOUNTER — Other Ambulatory Visit (HOSPITAL_COMMUNITY): Payer: Self-pay

## 2022-12-22 ENCOUNTER — Other Ambulatory Visit (HOSPITAL_COMMUNITY): Payer: Self-pay

## 2022-12-22 ENCOUNTER — Telehealth: Payer: Self-pay

## 2022-12-22 NOTE — Telephone Encounter (Signed)
Pharmacy Patient Advocate Encounter After speaking with patient/insurance, clarified he is needing a PA renewal for next year, but this must be submitted through an outside source (not CMM). PA request submitted through [email or web address]   Received notification from Physician's Office that prior authorization for REPATHA is required/requested.   Insurance verification completed.   The patient is insured through  [employer]  .   Per test claim: PA required; PA submitted to above mentioned insurance via [email or web address] Key/confirmation #/EOC NA Status is pending

## 2022-12-25 ENCOUNTER — Telehealth: Payer: Self-pay

## 2022-12-25 DIAGNOSIS — I4891 Unspecified atrial fibrillation: Secondary | ICD-10-CM

## 2022-12-25 NOTE — Telephone Encounter (Signed)
   Alert received from Biotronik.  2 episodes of atrial fibrillation.  Total time 2 hours and 54 minutes.  This is first episodes of atrial fibrillation noted since Pt implant.

## 2022-12-25 NOTE — Telephone Encounter (Signed)
Outreach made to Pt.  Per Pt he was asleep during the time of these episodes.  Advised would forward to Dr. Lalla Brothers for review and call back with advisement.

## 2022-12-26 IMAGING — CR DG CHEST 2V
2 series · 2 of 2 positions shown · non-contrast
Comparison: Portable exam of 05/04/2020

CLINICAL DATA: ICD placement, smoker

EXAM:
CHEST - 2 VIEW

[chest lat]
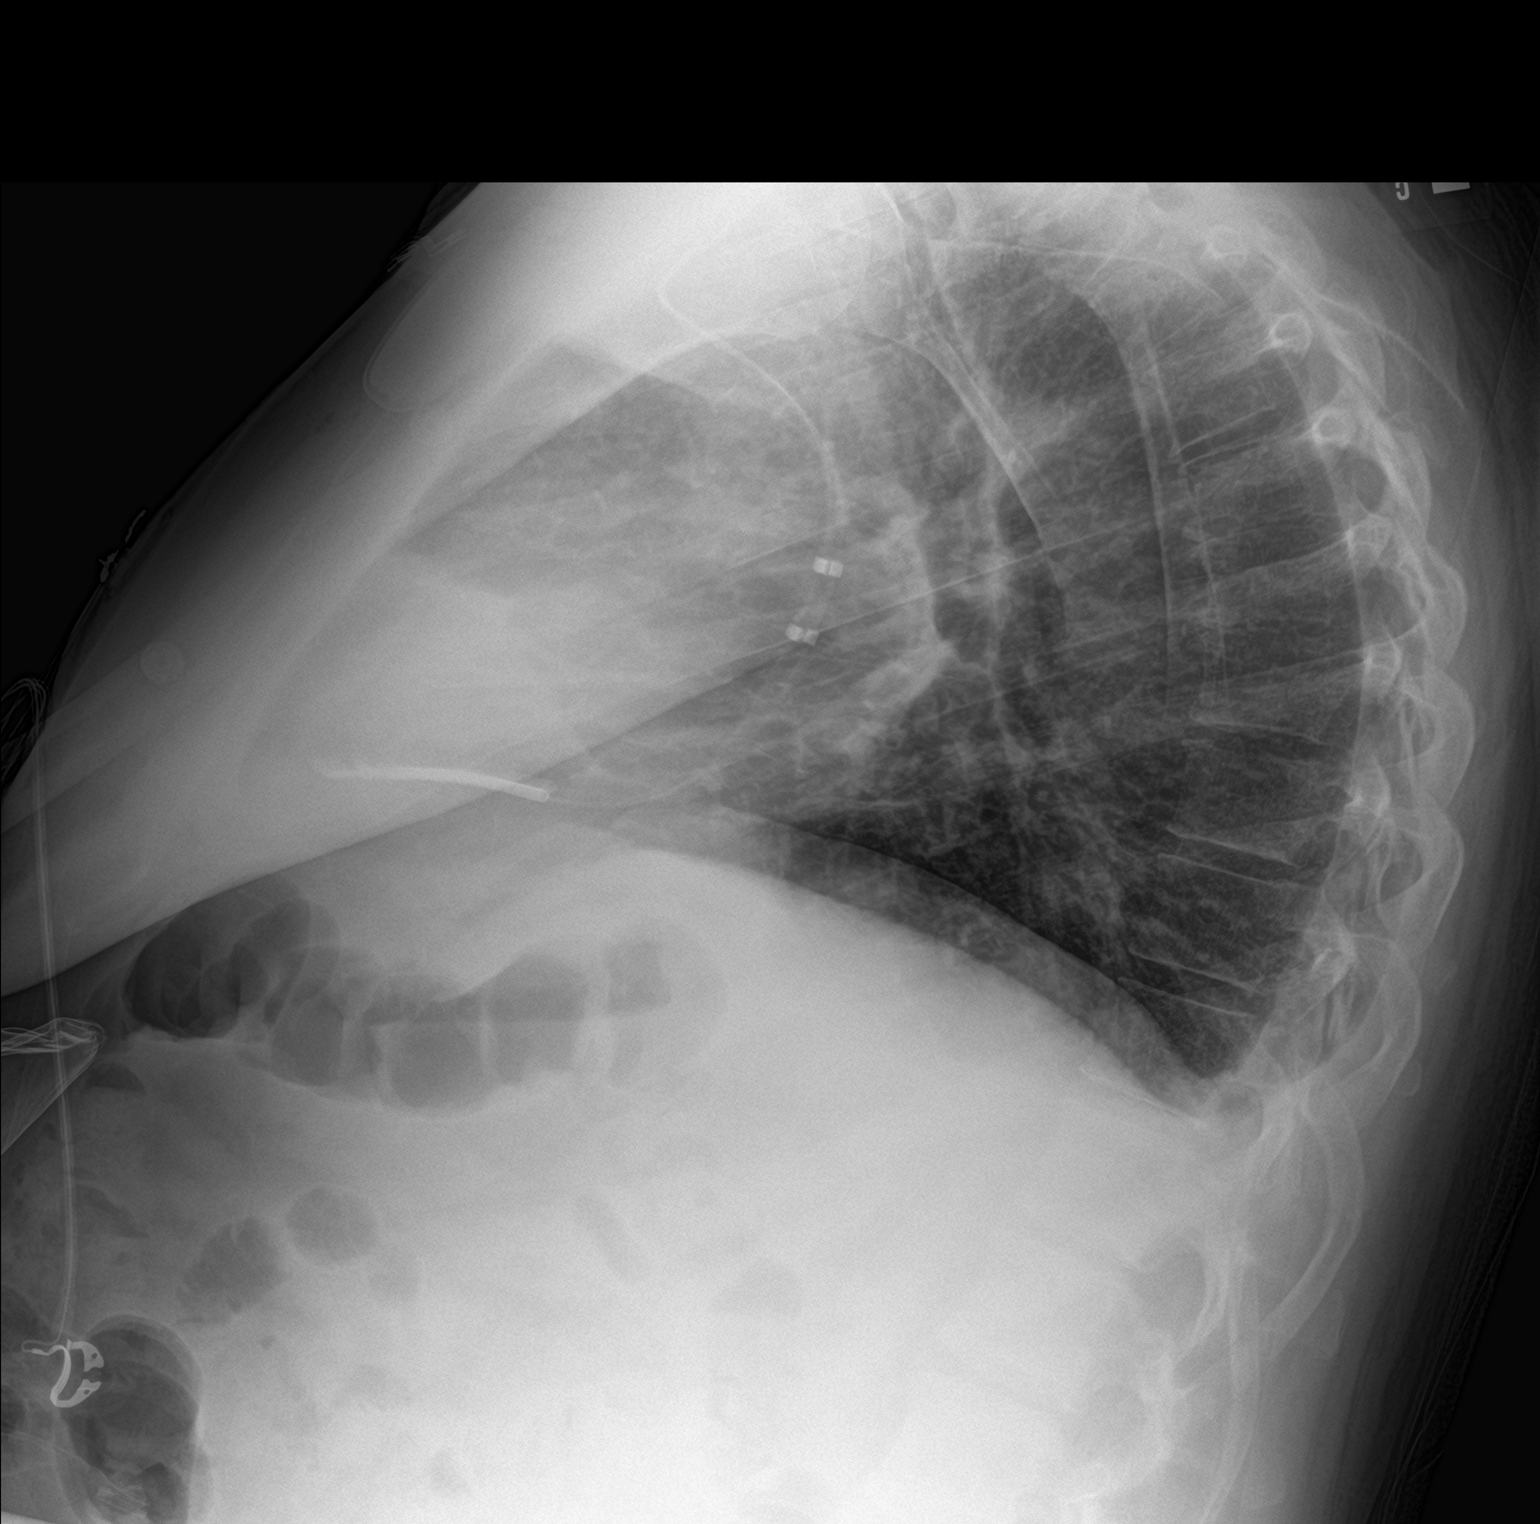

[chest ap]
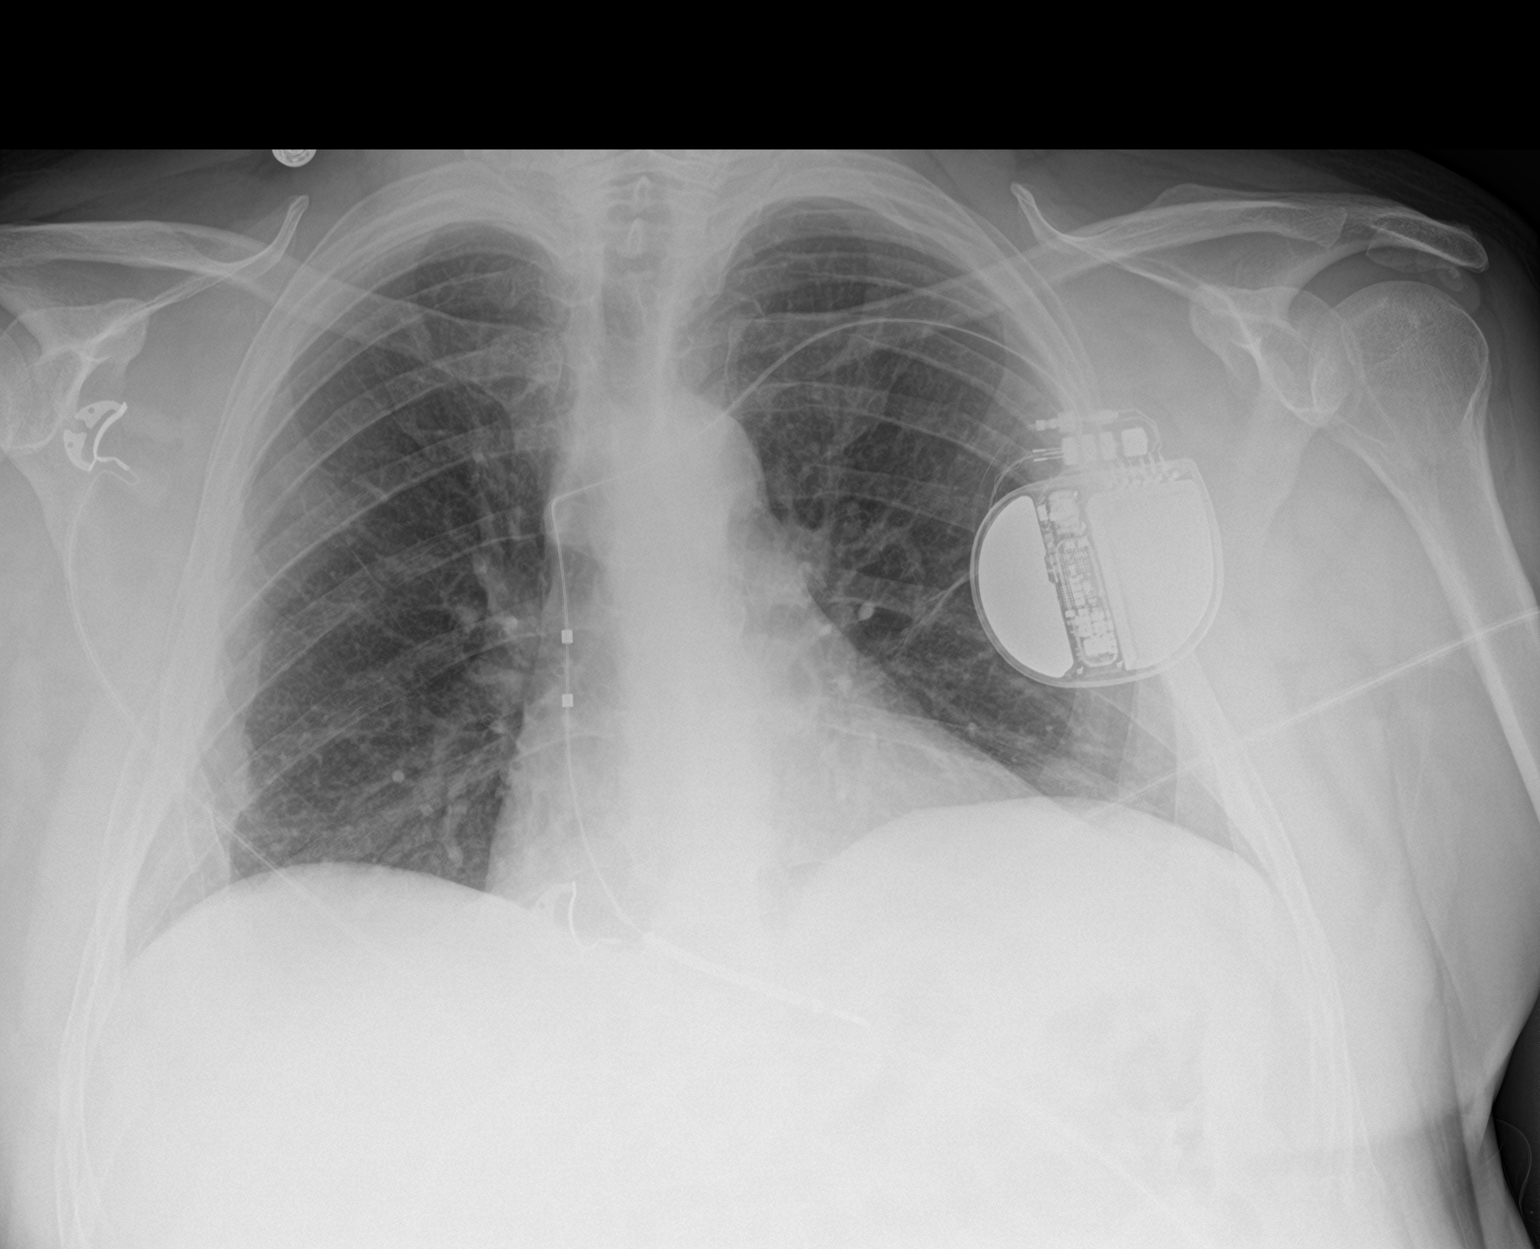

[2 of 2 positions shown; findings below may reference images not displayed]

FINDINGS: New LEFT subclavian ICD with lead projecting at RIGHT ventricle.

Normal heart size, mediastinal contours, and pulmonary vascularity.

Lungs clear.

No infiltrate, pleural effusion, or pneumothorax.

Prior RIGHT rib fractures.
IMPRESSION: No acute abnormalities.

## 2022-12-27 NOTE — Telephone Encounter (Signed)
Called patient and made him aware of referral to AFIB clinic. Patient verbalized understanding.

## 2022-12-28 ENCOUNTER — Other Ambulatory Visit (HOSPITAL_COMMUNITY): Payer: Self-pay

## 2022-12-28 ENCOUNTER — Ambulatory Visit (HOSPITAL_COMMUNITY)
Admission: RE | Admit: 2022-12-28 | Discharge: 2022-12-28 | Disposition: A | Discharge status: Home / Self Care | Payer: BC Managed Care – PPO | Source: Ambulatory Visit | Attending: Internal Medicine | Admitting: Internal Medicine

## 2022-12-28 VITALS — BP 140/88 | HR 68 | Ht 70.0 in | Wt 223.2 lb

## 2022-12-28 DIAGNOSIS — I509 Heart failure, unspecified: Secondary | ICD-10-CM | POA: Diagnosis not present

## 2022-12-28 DIAGNOSIS — I4891 Unspecified atrial fibrillation: Secondary | ICD-10-CM

## 2022-12-28 DIAGNOSIS — I480 Paroxysmal atrial fibrillation: Secondary | ICD-10-CM | POA: Diagnosis present

## 2022-12-28 DIAGNOSIS — I110 Hypertensive heart disease with heart failure: Secondary | ICD-10-CM | POA: Insufficient documentation

## 2022-12-28 DIAGNOSIS — Z7902 Long term (current) use of antithrombotics/antiplatelets: Secondary | ICD-10-CM | POA: Diagnosis not present

## 2022-12-28 DIAGNOSIS — D6869 Other thrombophilia: Secondary | ICD-10-CM | POA: Insufficient documentation

## 2022-12-28 DIAGNOSIS — I2510 Atherosclerotic heart disease of native coronary artery without angina pectoris: Secondary | ICD-10-CM | POA: Diagnosis not present

## 2022-12-28 DIAGNOSIS — Z8679 Personal history of other diseases of the circulatory system: Secondary | ICD-10-CM | POA: Insufficient documentation

## 2022-12-28 LAB — CBC
HCT: 45.2 % (ref 39.0–52.0)
Hemoglobin: 15.5 g/dL (ref 13.0–17.0)
MCH: 31.3 pg (ref 26.0–34.0)
MCHC: 34.3 g/dL (ref 30.0–36.0)
MCV: 91.3 fL (ref 80.0–100.0)
Platelets: 288 10*3/uL (ref 150–400)
RBC: 4.95 MIL/uL (ref 4.22–5.81)
RDW: 13.2 % (ref 11.5–15.5)
WBC: 7.8 10*3/uL (ref 4.0–10.5)
nRBC: 0 % (ref 0.0–0.2)

## 2022-12-28 NOTE — Telephone Encounter (Signed)
Called plan to follow up on PA status. Rep said they won't start processing claims on this plan until next week 01/01/23 because these benefits are not active until 01/10/23. Expecting a determination by end of next week.

## 2022-12-28 NOTE — Progress Notes (Addendum)
Primary Care Physician: Dois Davenport, MD Primary Cardiologist: None Electrophysiologist: Lanier Prude, MD     Referring Physician: Device clinic     Tyler Jacobson is a 47 y.o. male with a history of ICM, HTN, CAD, VT/VF arrest out of hospital s/p ICD 05/2020, and paroxysmal atrial fibrillation who presents for consultation in the [facility]. Device clinic alert on 12/16 for 2 episodes of PAF totaling almost 3 hours. Patient has a CHADS2VASC score of 2.  On evaluation today, he is currently in NSR. He did not have cardiac awareness of episodes. He is currently on plavix daily. He admits to smoking marijuana on a regular basis. He will have ~4 alcoholic beverages every couple of weeks.   Today, he denies symptoms of palpitations, chest pain, shortness of breath, orthopnea, PND, lower extremity edema, dizziness, presyncope, syncope, snoring, daytime somnolence, bleeding, or neurologic sequela. The patient is tolerating medications without difficulties and is otherwise without complaint today.    he has a BMI of Body mass index is 32.03 kg/m.Marland Kitchen Filed Weights   12/28/22 0940  Weight: 101.2 kg    Current Outpatient Medications  Medication Sig Dispense Refill   carvedilol (COREG) 6.25 MG tablet TAKE 1 TABLET BY MOUTH TWICE A DAY WITH FOOD 180 tablet 3   Cimetidine (TAGAMET PO) Take 1 tablet by mouth as needed.     clopidogrel (PLAVIX) 75 MG tablet TAKE 1 TABLET BY MOUTH EVERY DAY 90 tablet 3   dapagliflozin propanediol (FARXIGA) 10 MG TABS tablet Take 1 tablet (10 mg total) by mouth daily. 90 tablet 2   Evolocumab (REPATHA SURECLICK) 140 MG/ML SOAJ INJECT 1 PEN INTO THE SKIN EVERY 14 (FOURTEEN) DAYS. 6 mL 3   losartan (COZAAR) 25 MG tablet TAKE 1/2 TABLET BY MOUTH TWICE A DAY 180 tablet 3   Multiple Vitamin (MULTIVITAMIN) capsule Take 1 capsule by mouth daily.     rosuvastatin (CRESTOR) 20 MG tablet Take 1 tablet (20 mg total) by mouth daily. 90  tablet 3   Semaglutide, 1 MG/DOSE, (OZEMPIC, 1 MG/DOSE,) 4 MG/3ML SOPN INJECT 1 MG INTO THE SKIN ONE TIME PER WEEK 3 mL 11   sildenafil (VIAGRA) 50 MG tablet Take 1 tablet (50 mg total) by mouth daily as needed for erectile dysfunction. 10 tablet 3   VASCEPA 1 g capsule TAKE 2 CAPSULES BY MOUTH TWICE A DAY 360 capsule 1   No current facility-administered medications for this encounter.    Atrial Fibrillation Management history:  Previous antiarrhythmic drugs: none Previous cardioversions: none Previous ablations: none Anticoagulation history: none   ROS- All systems are reviewed and negative except as per the HPI above.  Physical Exam: BP (!) 140/88   Pulse 68   Ht 5\' 10"  (1.778 m)   Wt 101.2 kg   BMI 32.03 kg/m   GEN: Well nourished, well developed in no acute distress NECK: No JVD; No carotid bruits CARDIAC: Regular rate and rhythm, no murmurs, rubs, gallops RESPIRATORY:  Clear to auscultation without rales, wheezing or rhonchi  ABDOMEN: Soft, non-tender, non-distended EXTREMITIES:  No edema; No deformity   EKG today demonstrates  Vent. rate 68 BPM PR interval 154 ms QRS duration 94 ms QT/QTcB 380/404 ms P-R-T axes 31 77 36 Normal sinus rhythm Inferior infarct , age undetermined Anterolateral infarct , age undetermined Abnormal ECG When compared with ECG of 11-May-2022 11:00, PREVIOUS ECG IS PRESENT  Echo 10/29/20 demonstrated  1. Left ventricular ejection fraction, by estimation, is 60  to 65%. Left  ventricular ejection fraction by 3D volume is 65 %. The left ventricle has  normal function. The left ventricle demonstrates regional wall motion  abnormalities (see scoring  diagram/findings for description). Left ventricular diastolic parameters  were normal. There is mild hypokinesis of the left ventricular, apical  inferoseptal wall and inferior wall.   2. Right ventricular systolic function is normal. The right ventricular  size is normal. There is normal  pulmonary artery systolic pressure.   3. The mitral valve is normal in structure. No evidence of mitral valve  regurgitation. No evidence of mitral stenosis.   4. The aortic valve is normal in structure. Aortic valve regurgitation is  not visualized. No aortic stenosis is present.   5. The inferior vena cava is normal in size with greater than 50%  respiratory variability, suggesting right atrial pressure of 3 mmHg.   ASSESSMENT & PLAN CHA2DS2-VASc Score = 2  The patient's score is based upon: CHF History: 0 HTN History: 1 Diabetes History: 0 Stroke History: 0 Vascular Disease History: 1 Age Score: 0 Gender Score: 0       ASSESSMENT AND PLAN: Paroxysmal Atrial Fibrillation (ICD10:  I48.0) The patient's CHA2DS2-VASc score is 2, indicating a 2.2% annual risk of stroke.    He is in NSR. Education provided about Afib. Discussion about medication treatments and ablation going forward if indicated. After discussion, we will proceed with conservative observation at this time via subsequent device interrogations.   Secondary Hypercoagulable State (ICD10:  D68.69) The patient is at significant risk for stroke/thromboembolism based upon his CHA2DS2-VASc Score of 2.    We had a long discussion regarding risks vs benefits of anticoagulation. I advised will reach out to his cardiologist regarding whether to continue plavix. He would like a preliminary CBC since last one was 06/2021 to compare if he decides to begin Eliquis 5 mg BID. He is unsure of starting anticoagulation due to the possible risks of bleeding. I did discuss with him the Watchman procedure and have given him a pamphlet to review.   Patient will call clinic if he decides to begin anticoagulation. If he does begin Eliquis, will set up 1 month f/u to draw CBC. Discussion with Dr. Shirlee Latch - if patient starts Eliquis okay to stop plavix. Needs follow up with Dr. Shirlee Latch.    Lake Bells, PA-C  [facility] [address] [place], [place] [address] [phone]

## 2022-12-28 NOTE — Addendum Note (Signed)
Encounter addended by: Eustace Pen, PA-C on: 12/28/2022 2:50 PM  Actions taken: Clinical Note Signed

## 2023-01-11 NOTE — Telephone Encounter (Signed)
 Pharmacy Patient Advocate Encounter  Received notification from  [employer]  that Prior Authorization for REPATHA  has been DENIED.  Full denial letter will be uploaded to the media tab. See denial reason below.   Per plan, patient does meet their criteria for approval (need CAC score of 300 or greater or a pre treatment LDL of 190 or greater. (See full denial on chart media)

## 2023-01-15 NOTE — Telephone Encounter (Signed)
 Pharmacy Patient Advocate Encounter  Received notification from  [employer]  that Prior Authorization for OZEMPIC  has been DENIED.  Full denial letter will be uploaded to the media tab. See denial reason below.  Only covered if patient has type 2 diabetes

## 2023-01-26 ENCOUNTER — Telehealth: Payer: Self-pay | Admitting: Pharmacy Technician

## 2023-01-26 ENCOUNTER — Telehealth: Payer: Self-pay | Admitting: Cardiology

## 2023-01-26 NOTE — Telephone Encounter (Signed)
Pt c/o medication issue:  1. Name of Medication:  Semaglutide, 1 MG/DOSE, (OZEMPIC, 1 MG/DOSE,) 4 MG/3ML SOPN Evolocumab (REPATHA SURECLICK) 140 MG/ML SOAJ  2. How are you currently taking this medication (dosage and times per day)?   3. Are you having a reaction (difficulty breathing--STAT)?   4. What is your medication issue?   Patient states the PA for medications have been denied and he is almost completely out. He would like further assistance.

## 2023-01-26 NOTE — Telephone Encounter (Signed)
  Received notification from  [facility]  that Prior Authorization for REPATHA has been DENIED.  Full denial letter will be uploaded to the media tab. See denial reason below.    Per plan, patient does meet their criteria for approval (need CAC score of 300 or greater or a pre treatment LDL of 190 or greater. (See full denial on chart media)  Routing back to triage/pharmd.. to see what his options are since there is nothing on the assistance side we can do for a denial of medication.

## 2023-01-26 NOTE — Telephone Encounter (Signed)
Repatha denied  Ok to refer back to lipid clinic to discuss alternatives?

## 2023-02-06 ENCOUNTER — Ambulatory Visit (INDEPENDENT_AMBULATORY_CARE_PROVIDER_SITE_OTHER): Payer: BC Managed Care – PPO

## 2023-02-06 ENCOUNTER — Encounter: Payer: Self-pay | Admitting: Cardiology

## 2023-02-06 DIAGNOSIS — I480 Paroxysmal atrial fibrillation: Secondary | ICD-10-CM

## 2023-02-06 LAB — CUP PACEART REMOTE DEVICE CHECK
Date Time Interrogation Session: 20250128082158
Implantable Lead Connection Status: 753985
Implantable Lead Implant Date: 20220502
Implantable Lead Location: 753860
Implantable Lead Model: 436909
Implantable Lead Serial Number: 81384798
Implantable Pulse Generator Implant Date: 20220502
Pulse Gen Model: 429525
Pulse Gen Serial Number: 84818808

## 2023-02-08 ENCOUNTER — Telehealth: Payer: Self-pay | Admitting: Cardiology

## 2023-02-08 NOTE — Telephone Encounter (Signed)
Pt calling requesting cb to get update on status of Repatha appeal or what is going on.

## 2023-02-09 ENCOUNTER — Other Ambulatory Visit (HOSPITAL_COMMUNITY): Payer: Self-pay

## 2023-02-09 ENCOUNTER — Telehealth: Payer: Self-pay | Admitting: Pharmacy Technician

## 2023-02-09 NOTE — Telephone Encounter (Signed)
Pharmacy Patient Advocate Encounter   Received notification from Physician's Office that prior authorization for repatha is required/requested.   Insurance verification completed.   The patient is insured through  [employer]  .   Per test claim: PA required; PA submitted to above mentioned insurance via [employer] website Key/confirmation #/EOC website Status is pending

## 2023-02-09 NOTE — Telephone Encounter (Signed)
See mychart message. Will try to resubmit under correct indication ASCVD

## 2023-02-12 ENCOUNTER — Telehealth: Payer: Self-pay | Admitting: Pharmacy Technician

## 2023-02-12 ENCOUNTER — Other Ambulatory Visit (HOSPITAL_COMMUNITY): Payer: Self-pay

## 2023-02-12 NOTE — Telephone Encounter (Signed)
Pharmacy Patient Advocate Encounter  Received notification from  [employer]  that Prior Authorization for Repatha has been APPROVED from 02/09/23 to 02/09/24   PA #/Case ID/Reference #: [id number]

## 2023-02-12 NOTE — Telephone Encounter (Signed)
Can we submit PA for [facility] for CAD indication. Pt with hx of NSTEMI. Thanks And thank your for your help getting the Repatha resubmitted

## 2023-02-12 NOTE — Telephone Encounter (Signed)
Pharmacy Patient Advocate Encounter   Received notification from Pt Calls Messages that prior authorization for wegovy is required/requested.   Insurance verification completed.   The patient is insured through  [employer]  .   Per test claim: PA required; PA submitted to above mentioned insurance via website Key/confirmation #/EOC [employer] website Status is pending

## 2023-02-16 ENCOUNTER — Other Ambulatory Visit (HOSPITAL_COMMUNITY): Payer: Self-pay

## 2023-02-19 ENCOUNTER — Encounter: Payer: Self-pay | Admitting: Pharmacist

## 2023-02-20 MED ORDER — WEGOVY 1.7 MG/0.75ML ~~LOC~~ SOAJ: 1.7000 mg | SUBCUTANEOUS | 3 refills | Status: DC

## 2023-02-23 ENCOUNTER — Telehealth: Payer: Self-pay | Admitting: Pharmacy Technician

## 2023-02-23 ENCOUNTER — Other Ambulatory Visit (HOSPITAL_COMMUNITY): Payer: Self-pay

## 2023-02-23 NOTE — Telephone Encounter (Signed)
Received fax saying wegovy needed a prior auth but it was already approved. I called [facility] and they said it is now fixed and it is 10.00. she said pt was aware

## 2023-03-18 NOTE — Progress Notes (Signed)
 Remote ICD transmission.

## 2023-05-08 ENCOUNTER — Ambulatory Visit (INDEPENDENT_AMBULATORY_CARE_PROVIDER_SITE_OTHER): Payer: BC Managed Care – PPO

## 2023-05-08 DIAGNOSIS — I469 Cardiac arrest, cause unspecified: Secondary | ICD-10-CM

## 2023-05-08 LAB — CUP PACEART REMOTE DEVICE CHECK
Date Time Interrogation Session: 20250429083622
Implantable Lead Connection Status: 753985
Implantable Lead Implant Date: 20220502
Implantable Lead Location: 753860
Implantable Lead Model: 436909
Implantable Lead Serial Number: 81384798
Implantable Pulse Generator Implant Date: 20220502
Pulse Gen Model: 429525
Pulse Gen Serial Number: 84818808

## 2023-05-13 ENCOUNTER — Encounter: Payer: Self-pay | Admitting: Cardiology

## 2023-06-10 ENCOUNTER — Other Ambulatory Visit: Payer: Self-pay | Admitting: Cardiology

## 2023-06-22 NOTE — Progress Notes (Signed)
 Remote ICD transmission.

## 2023-06-25 LAB — COLOGUARD: COLOGUARD: NEGATIVE

## 2023-07-01 ENCOUNTER — Other Ambulatory Visit (HOSPITAL_COMMUNITY): Payer: Self-pay | Admitting: Cardiology

## 2023-07-14 ENCOUNTER — Other Ambulatory Visit (HOSPITAL_COMMUNITY): Payer: Self-pay | Admitting: Cardiology

## 2023-07-15 ENCOUNTER — Other Ambulatory Visit (HOSPITAL_COMMUNITY): Payer: Self-pay | Admitting: Cardiology

## 2023-07-16 ENCOUNTER — Other Ambulatory Visit: Payer: Self-pay

## 2023-07-16 MED ORDER — DAPAGLIFLOZIN PROPANEDIOL 10 MG PO TABS: 10.0000 mg | ORAL_TABLET | Freq: Every day | ORAL | 0 refills | Status: DC

## 2023-07-16 MED ORDER — CARVEDILOL 6.25 MG PO TABS: 6.2500 mg | ORAL_TABLET | Freq: Two times a day (BID) | ORAL | 0 refills | Status: DC

## 2023-07-16 MED ORDER — ROSUVASTATIN CALCIUM 20 MG PO TABS: 20.0000 mg | ORAL_TABLET | Freq: Every day | ORAL | 0 refills | Status: DC

## 2023-07-16 NOTE — Addendum Note (Signed)
 Addended by: BLUFORD RAMP D on: 07/16/2023 12:20 PM   Modules accepted: Orders

## 2023-07-17 ENCOUNTER — Other Ambulatory Visit (HOSPITAL_COMMUNITY): Payer: Self-pay | Admitting: Cardiology

## 2023-07-18 ENCOUNTER — Telehealth (HOSPITAL_COMMUNITY): Payer: Self-pay
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
## 2023-07-18 NOTE — Telephone Encounter (Signed)
 Called to confirm/remind patient of their appointment at the [facility] on 07/19/23.   Appointment:   [x] Confirmed  [] Left mess   [] No answer/No voice mail  [] VM Full/unable to leave message  [] Phone not in service  Patient reminded to bring all medications and/or complete list.  Confirmed patient has transportation. Gave directions, instructed to utilize valet parking.

## 2023-07-19 ENCOUNTER — Encounter (HOSPITAL_COMMUNITY): Payer: Self-pay

## 2023-07-19 ENCOUNTER — Other Ambulatory Visit (HOSPITAL_COMMUNITY): Payer: Self-pay

## 2023-07-19 ENCOUNTER — Encounter: Payer: Self-pay | Admitting: Cardiology

## 2023-07-19 ENCOUNTER — Ambulatory Visit (HOSPITAL_COMMUNITY)
Admission: RE | Admit: 2023-07-19 | Discharge: 2023-07-19 | Disposition: A | Discharge status: Home / Self Care | Payer: Self-pay | Source: Ambulatory Visit | Attending: Adult Health | Admitting: Adult Health

## 2023-07-19 ENCOUNTER — Telehealth (HOSPITAL_COMMUNITY): Payer: Self-pay | Admitting: Pharmacy Technician

## 2023-07-19 VITALS — BP 112/78 | HR 71 | Wt 211.1 lb

## 2023-07-19 DIAGNOSIS — Z7902 Long term (current) use of antithrombotics/antiplatelets: Secondary | ICD-10-CM | POA: Insufficient documentation

## 2023-07-19 DIAGNOSIS — Z87891 Personal history of nicotine dependence: Secondary | ICD-10-CM | POA: Insufficient documentation

## 2023-07-19 DIAGNOSIS — Z95810 Presence of automatic (implantable) cardiac defibrillator: Secondary | ICD-10-CM | POA: Insufficient documentation

## 2023-07-19 DIAGNOSIS — I5022 Chronic systolic (congestive) heart failure: Secondary | ICD-10-CM | POA: Diagnosis present

## 2023-07-19 DIAGNOSIS — I2510 Atherosclerotic heart disease of native coronary artery without angina pectoris: Secondary | ICD-10-CM | POA: Insufficient documentation

## 2023-07-19 DIAGNOSIS — I252 Old myocardial infarction: Secondary | ICD-10-CM | POA: Diagnosis not present

## 2023-07-19 DIAGNOSIS — Z79899 Other long term (current) drug therapy: Secondary | ICD-10-CM | POA: Diagnosis not present

## 2023-07-19 DIAGNOSIS — E782 Mixed hyperlipidemia: Secondary | ICD-10-CM | POA: Diagnosis not present

## 2023-07-19 DIAGNOSIS — E785 Hyperlipidemia, unspecified: Secondary | ICD-10-CM | POA: Insufficient documentation

## 2023-07-19 DIAGNOSIS — I10 Essential (primary) hypertension: Secondary | ICD-10-CM | POA: Diagnosis not present

## 2023-07-19 DIAGNOSIS — Z7984 Long term (current) use of oral hypoglycemic drugs: Secondary | ICD-10-CM | POA: Diagnosis not present

## 2023-07-19 DIAGNOSIS — I469 Cardiac arrest, cause unspecified: Secondary | ICD-10-CM | POA: Insufficient documentation

## 2023-07-19 DIAGNOSIS — I255 Ischemic cardiomyopathy: Secondary | ICD-10-CM | POA: Diagnosis not present

## 2023-07-19 MED ORDER — LOSARTAN POTASSIUM 25 MG PO TABS: 25.0000 mg | ORAL_TABLET | Freq: Two times a day (BID) | ORAL | 3 refills | Status: AC

## 2023-07-19 MED ORDER — LOSARTAN POTASSIUM 25 MG PO TABS: 25.0000 mg | ORAL_TABLET | Freq: Two times a day (BID) | ORAL | 3 refills | Status: DC

## 2023-07-19 MED ORDER — FENOFIBRATE 145 MG PO TABS: 145.0000 mg | ORAL_TABLET | Freq: Every day | ORAL | 3 refills | Status: AC

## 2023-07-19 NOTE — Telephone Encounter (Signed)
 Pharmacy Patient Advocate Encounter  Insurance verification completed.   The patient is insured through [employer].  Ran test claim for Vascepa  (generic). Currently a quantity of 120 is a 30 day supply and the co-pay is $85 . Brand name Vascepa  would be $344 for a 30 day supply. The co-pay card would only take off $150.  Was also asked to run a test claim for Fenofibrate . A quantity of 90 would be $19.98 (30 would be $11.33).  This test claim was processed through [facility]- copay amounts may vary at other pharmacies due to pharmacy/plan contracts, or as the patient moves through the different stages of their insurance plan.   Almarie JULIANNA Pa, CPhT

## 2023-07-19 NOTE — Addendum Note (Signed)
 Encounter addended by: Buell Powell HERO, RN on: 07/19/2023 10:01 AM  Actions taken: Order list changed

## 2023-07-19 NOTE — Patient Instructions (Signed)
 Medication Changes:  STOP Vascepa   START Fenofibrate  145 mg Daily   Special Instructions // Education:  Do the following things EVERYDAY: Weigh yourself in the morning before breakfast. Write it down and keep it in a log. Take your medicines as prescribed Eat low salt foods--Limit salt (sodium) to 2000 mg per day.  Stay as active as you can everyday Limit all fluids for the day to less than 2 liters   Follow-Up in: 1 year (July 2026), **PLEASE CALL OUR OFFICE IN MAY TO SCHEDULE THIS APPOINTMENT   At the [facility], you and your health needs are our priority. We have a designated team specialized in the treatment of Heart Failure. This Care Team includes your primary Heart Failure Specialized Cardiologist (physician), Advanced Practice Providers (APPs- Physician Assistants and Nurse Practitioners), and Pharmacist who all work together to provide you with the care you need, when you need it.   You may see any of the following providers on your designated Care Team at your next follow up:  Dr. Toribio Fuel Dr. Ezra Shuck Dr. Ria Commander Dr. Odis Brownie Greig Mosses, NP Caffie Shed, [place] [facility], [place] Beckey Coe, NP [place] Lee, NP Tinnie Redman, PharmD   Please be sure to bring in all your medications bottles to every appointment.   Need to Contact Us :  If you have any questions or concerns before your next appointment please send us  a message through [place] or call our office at [phone].    TO LEAVE A MESSAGE FOR THE NURSE SELECT OPTION 2, PLEASE LEAVE A MESSAGE INCLUDING: YOUR NAME DATE OF BIRTH CALL BACK NUMBER REASON FOR CALL**this is important as we prioritize the call backs  YOU WILL RECEIVE A CALL BACK THE SAME DAY AS LONG AS YOU CALL BEFORE 4:00 PM

## 2023-07-25 ENCOUNTER — Other Ambulatory Visit (HOSPITAL_COMMUNITY): Payer: Self-pay | Admitting: Cardiology

## 2023-07-26 ENCOUNTER — Other Ambulatory Visit (HOSPITAL_COMMUNITY): Payer: Self-pay

## 2023-07-26 DIAGNOSIS — I5022 Chronic systolic (congestive) heart failure: Secondary | ICD-10-CM

## 2023-07-26 DIAGNOSIS — E782 Mixed hyperlipidemia: Secondary | ICD-10-CM

## 2023-07-26 MED ORDER — REPATHA SURECLICK 140 MG/ML ~~LOC~~ SOAJ: 140.0000 mg | SUBCUTANEOUS | 5 refills | Status: DC

## 2023-07-27 ENCOUNTER — Ambulatory Visit (HOSPITAL_COMMUNITY): Payer: Self-pay | Admitting: Adult Health

## 2023-08-07 ENCOUNTER — Ambulatory Visit (INDEPENDENT_AMBULATORY_CARE_PROVIDER_SITE_OTHER): Payer: BC Managed Care – PPO

## 2023-08-07 DIAGNOSIS — I469 Cardiac arrest, cause unspecified: Secondary | ICD-10-CM | POA: Diagnosis not present

## 2023-08-07 LAB — CUP PACEART REMOTE DEVICE CHECK
Date Time Interrogation Session: 20250729093956
Implantable Lead Connection Status: 753985
Implantable Lead Implant Date: 20220502
Implantable Lead Location: 753860
Implantable Lead Model: 436909
Implantable Lead Serial Number: 81384798
Implantable Pulse Generator Implant Date: 20220502
Pulse Gen Model: 429525
Pulse Gen Serial Number: 84818808

## 2023-08-08 ENCOUNTER — Other Ambulatory Visit (HOSPITAL_COMMUNITY): Payer: Self-pay | Admitting: Cardiology

## 2023-08-09 ENCOUNTER — Ambulatory Visit: Payer: Self-pay | Admitting: Cardiology

## 2023-10-06 ENCOUNTER — Other Ambulatory Visit: Payer: Self-pay | Admitting: Cardiology

## 2023-10-10 NOTE — Progress Notes (Signed)
 Remote ICD Transmission

## 2023-11-06 ENCOUNTER — Ambulatory Visit (INDEPENDENT_AMBULATORY_CARE_PROVIDER_SITE_OTHER): Payer: BC Managed Care – PPO

## 2023-11-06 DIAGNOSIS — I480 Paroxysmal atrial fibrillation: Secondary | ICD-10-CM | POA: Diagnosis not present

## 2023-11-07 LAB — CUP PACEART REMOTE DEVICE CHECK
Date Time Interrogation Session: 20251029091219
Implantable Lead Connection Status: 753985
Implantable Lead Implant Date: 20220502
Implantable Lead Location: 753860
Implantable Lead Model: 436909
Implantable Lead Serial Number: 81384798
Implantable Pulse Generator Implant Date: 20220502
Pulse Gen Model: 429525
Pulse Gen Serial Number: 84818808

## 2023-11-08 ENCOUNTER — Ambulatory Visit: Payer: Self-pay | Admitting: Cardiology

## 2023-11-13 NOTE — Progress Notes (Signed)
 Remote ICD Transmission

## 2023-12-29 ENCOUNTER — Other Ambulatory Visit: Payer: Self-pay | Admitting: Cardiology

## 2024-01-15 ENCOUNTER — Other Ambulatory Visit: Payer: Self-pay | Admitting: Physician Assistant

## 2024-01-15 DIAGNOSIS — I5022 Chronic systolic (congestive) heart failure: Secondary | ICD-10-CM

## 2024-01-15 DIAGNOSIS — E782 Mixed hyperlipidemia: Secondary | ICD-10-CM

## 2024-01-15 MED ORDER — REPATHA SURECLICK 140 MG/ML ~~LOC~~ SOAJ: 140.0000 mg | SUBCUTANEOUS | 5 refills | Status: AC

## 2024-02-05 ENCOUNTER — Ambulatory Visit: Payer: BC Managed Care – PPO

## 2024-02-05 DIAGNOSIS — I255 Ischemic cardiomyopathy: Secondary | ICD-10-CM | POA: Diagnosis not present

## 2024-02-06 LAB — CUP PACEART REMOTE DEVICE CHECK
Date Time Interrogation Session: 20260127080618
Implantable Lead Connection Status: 753985
Implantable Lead Implant Date: 20220502
Implantable Lead Location: 753860
Implantable Lead Model: 436909
Implantable Lead Serial Number: 81384798
Implantable Pulse Generator Implant Date: 20220502
Pulse Gen Model: 429525
Pulse Gen Serial Number: 84818808

## 2024-02-07 ENCOUNTER — Ambulatory Visit: Payer: Self-pay | Admitting: Cardiovascular Disease

## 2024-02-09 ENCOUNTER — Other Ambulatory Visit (HOSPITAL_COMMUNITY): Payer: Self-pay | Admitting: Cardiology

## 2024-02-10 ENCOUNTER — Other Ambulatory Visit (HOSPITAL_COMMUNITY): Payer: Self-pay | Admitting: Cardiology

## 2024-02-13 NOTE — Progress Notes (Signed)
 Remote ICD Transmission

## 2024-03-13 ENCOUNTER — Ambulatory Visit: Admitting: Physician Assistant

## 2024-05-06 ENCOUNTER — Ambulatory Visit: Payer: BC Managed Care – PPO

## 2024-08-05 ENCOUNTER — Ambulatory Visit: Payer: BC Managed Care – PPO

## 2024-11-04 ENCOUNTER — Ambulatory Visit: Payer: BC Managed Care – PPO

## 2025-02-03 ENCOUNTER — Ambulatory Visit: Payer: BC Managed Care – PPO
